# Patient Record
Sex: Male | Born: 1955 | Race: White | Hispanic: No | Marital: Married | Smoking: Former smoker
Health system: Southern US, Community
[De-identification: ages and names within clinical notes are randomized; demographics above are authoritative.]

## PROBLEM LIST (undated history)

## (undated) DIAGNOSIS — M1990 Unspecified osteoarthritis, unspecified site: Secondary | ICD-10-CM

## (undated) DIAGNOSIS — I10 Essential (primary) hypertension: Secondary | ICD-10-CM

## (undated) DIAGNOSIS — F32A Depression, unspecified: Secondary | ICD-10-CM

## (undated) DIAGNOSIS — J449 Chronic obstructive pulmonary disease, unspecified: Secondary | ICD-10-CM

## (undated) DIAGNOSIS — G4730 Sleep apnea, unspecified: Secondary | ICD-10-CM

## (undated) DIAGNOSIS — L409 Psoriasis, unspecified: Secondary | ICD-10-CM

## (undated) DIAGNOSIS — F429 Obsessive-compulsive disorder, unspecified: Secondary | ICD-10-CM

## (undated) DIAGNOSIS — M069 Rheumatoid arthritis, unspecified: Secondary | ICD-10-CM

## (undated) DIAGNOSIS — Z8719 Personal history of other diseases of the digestive system: Secondary | ICD-10-CM

## (undated) DIAGNOSIS — Z8619 Personal history of other infectious and parasitic diseases: Secondary | ICD-10-CM

## (undated) DIAGNOSIS — E785 Hyperlipidemia, unspecified: Secondary | ICD-10-CM

## (undated) DIAGNOSIS — F5104 Psychophysiologic insomnia: Secondary | ICD-10-CM

## (undated) DIAGNOSIS — E039 Hypothyroidism, unspecified: Secondary | ICD-10-CM

## (undated) DIAGNOSIS — G3184 Mild cognitive impairment, so stated: Secondary | ICD-10-CM

## (undated) DIAGNOSIS — C801 Malignant (primary) neoplasm, unspecified: Secondary | ICD-10-CM

## (undated) DIAGNOSIS — T7840XA Allergy, unspecified, initial encounter: Secondary | ICD-10-CM

## (undated) DIAGNOSIS — J45909 Unspecified asthma, uncomplicated: Secondary | ICD-10-CM

## (undated) DIAGNOSIS — E237 Disorder of pituitary gland, unspecified: Secondary | ICD-10-CM

## (undated) DIAGNOSIS — N529 Male erectile dysfunction, unspecified: Secondary | ICD-10-CM

## (undated) DIAGNOSIS — M2550 Pain in unspecified joint: Secondary | ICD-10-CM

## (undated) DIAGNOSIS — F329 Major depressive disorder, single episode, unspecified: Secondary | ICD-10-CM

## (undated) HISTORY — DX: Personal history of other diseases of the digestive system: Z87.19

## (undated) HISTORY — DX: Obsessive-compulsive disorder, unspecified: F42.9

## (undated) HISTORY — DX: Sleep apnea, unspecified: G47.30

## (undated) HISTORY — DX: Hypothyroidism, unspecified: E03.9

## (undated) HISTORY — DX: Essential (primary) hypertension: I10

## (undated) HISTORY — DX: Depression, unspecified: F32.A

## (undated) HISTORY — DX: Rheumatoid arthritis, unspecified: M06.9

## (undated) HISTORY — PX: MOHS SURGERY: SUR867

## (undated) HISTORY — DX: Major depressive disorder, single episode, unspecified: F32.9

## (undated) HISTORY — PX: CYSTECTOMY: SUR359

## (undated) HISTORY — PX: APPENDECTOMY: SHX54

## (undated) HISTORY — DX: Allergy, unspecified, initial encounter: T78.40XA

## (undated) HISTORY — DX: Unspecified osteoarthritis, unspecified site: M19.90

## (undated) HISTORY — DX: Personal history of other infectious and parasitic diseases: Z86.19

## (undated) HISTORY — DX: Malignant (primary) neoplasm, unspecified: C80.1

## (undated) HISTORY — PX: LAMINECTOMY: SHX219

## (undated) HISTORY — DX: Psoriasis, unspecified: L40.9

## (undated) HISTORY — DX: Hyperlipidemia, unspecified: E78.5

## (undated) HISTORY — DX: Male erectile dysfunction, unspecified: N52.9

## (undated) HISTORY — PX: OTHER SURGICAL HISTORY: SHX169

## (undated) HISTORY — PX: TONSILLECTOMY: SUR1361

---

## 2004-12-29 ENCOUNTER — Ambulatory Visit: Payer: Self-pay | Admitting: Internal Medicine

## 2005-01-11 ENCOUNTER — Ambulatory Visit: Payer: Self-pay | Admitting: Internal Medicine

## 2005-08-10 ENCOUNTER — Ambulatory Visit: Payer: Self-pay | Admitting: Physician Assistant

## 2005-08-12 ENCOUNTER — Observation Stay: Payer: Self-pay | Admitting: Unknown Physician Specialty

## 2005-08-12 ENCOUNTER — Other Ambulatory Visit: Payer: Self-pay

## 2008-04-01 ENCOUNTER — Ambulatory Visit: Payer: Self-pay

## 2010-12-07 ENCOUNTER — Ambulatory Visit: Payer: Self-pay | Admitting: Gastroenterology

## 2011-12-16 ENCOUNTER — Ambulatory Visit: Payer: Self-pay | Admitting: Internal Medicine

## 2012-12-24 ENCOUNTER — Inpatient Hospital Stay: Payer: Self-pay | Admitting: Internal Medicine

## 2012-12-24 LAB — COMPREHENSIVE METABOLIC PANEL
Albumin: 4.1 g/dL (ref 3.4–5.0)
Anion Gap: 6 — ABNORMAL LOW (ref 7–16)
BUN: 16 mg/dL (ref 7–18)
Bilirubin,Total: 0.7 mg/dL (ref 0.2–1.0)
Calcium, Total: 8.7 mg/dL (ref 8.5–10.1)
Co2: 27 mmol/L (ref 21–32)
Creatinine: 1.17 mg/dL (ref 0.60–1.30)
Glucose: 134 mg/dL — ABNORMAL HIGH (ref 65–99)
Osmolality: 279 (ref 275–301)
Potassium: 3.3 mmol/L — ABNORMAL LOW (ref 3.5–5.1)

## 2012-12-24 LAB — CBC
HGB: 16 g/dL (ref 13.0–18.0)
MCH: 29.9 pg (ref 26.0–34.0)
MCV: 90 fL (ref 80–100)
Platelet: 283 10*3/uL (ref 150–440)
RBC: 5.34 10*6/uL (ref 4.40–5.90)
RDW: 14.3 % (ref 11.5–14.5)
WBC: 15.6 10*3/uL — ABNORMAL HIGH (ref 3.8–10.6)

## 2012-12-24 LAB — TROPONIN I: Troponin-I: <0.02 ng/mL

## 2012-12-25 LAB — CBC WITH DIFFERENTIAL/PLATELET
Basophil #: 0 10*3/uL (ref 0.0–0.1)
Basophil %: 0.1 %
Eosinophil %: 0 %
HCT: 45.7 % (ref 40.0–52.0)
HGB: 15.5 g/dL (ref 13.0–18.0)
Lymphocyte %: 3.2 %
MCHC: 33.9 g/dL (ref 32.0–36.0)
MCV: 91 fL (ref 80–100)
Monocyte #: 0.9 x10 3/mm (ref 0.2–1.0)
Monocyte %: 5.7 %
Neutrophil #: 14.5 10*3/uL — ABNORMAL HIGH (ref 1.4–6.5)
Neutrophil %: 91 %
Platelet: 268 10*3/uL (ref 150–440)
WBC: 16 10*3/uL — ABNORMAL HIGH (ref 3.8–10.6)

## 2012-12-25 LAB — BASIC METABOLIC PANEL
Anion Gap: 5 — ABNORMAL LOW (ref 7–16)
BUN: 14 mg/dL (ref 7–18)
Calcium, Total: 7.8 mg/dL — ABNORMAL LOW (ref 8.5–10.1)
Chloride: 108 mmol/L — ABNORMAL HIGH (ref 98–107)
Creatinine: 1.11 mg/dL (ref 0.60–1.30)
EGFR (Non-African Amer.): >60
Glucose: 125 mg/dL — ABNORMAL HIGH (ref 65–99)
Osmolality: 285 (ref 275–301)
Sodium: 142 mmol/L (ref 136–145)

## 2012-12-25 LAB — URINALYSIS, COMPLETE
Blood: NEGATIVE
Glucose,UR: NEGATIVE mg/dL (ref 0–75)
Ketone: NEGATIVE
Protein: NEGATIVE
Specific Gravity: 1.01 (ref 1.003–1.030)

## 2012-12-25 LAB — PHOSPHORUS: Phosphorus: 2.4 mg/dL — ABNORMAL LOW (ref 2.5–4.9)

## 2012-12-25 LAB — HEMOGLOBIN A1C: Hemoglobin A1C: 6.1 % (ref 4.2–6.3)

## 2012-12-25 LAB — LIPID PANEL
Cholesterol: 148 mg/dL (ref 0–200)
HDL Cholesterol: 62 mg/dL — ABNORMAL HIGH (ref 40–60)
Triglycerides: 127 mg/dL (ref 0–200)
VLDL Cholesterol, Calc: 25 mg/dL (ref 5–40)

## 2012-12-26 LAB — COMPREHENSIVE METABOLIC PANEL
Albumin: 3 g/dL — ABNORMAL LOW (ref 3.4–5.0)
Alkaline Phosphatase: 85 U/L (ref 50–136)
Anion Gap: 5 — ABNORMAL LOW (ref 7–16)
BUN: 18 mg/dL (ref 7–18)
Bilirubin,Total: 0.7 mg/dL (ref 0.2–1.0)
Calcium, Total: 7.6 mg/dL — ABNORMAL LOW (ref 8.5–10.1)
Chloride: 110 mmol/L — ABNORMAL HIGH (ref 98–107)
EGFR (African American): >60
Glucose: 109 mg/dL — ABNORMAL HIGH (ref 65–99)
SGOT(AST): 15 U/L (ref 15–37)
SGPT (ALT): 23 U/L (ref 12–78)
Sodium: 143 mmol/L (ref 136–145)
Total Protein: 6.5 g/dL (ref 6.4–8.2)

## 2012-12-26 LAB — CBC WITH DIFFERENTIAL/PLATELET
Basophil %: 0.2 %
Eosinophil #: 0 10*3/uL (ref 0.0–0.7)
Eosinophil %: 0.3 %
Lymphocyte #: 0.5 10*3/uL — ABNORMAL LOW (ref 1.0–3.6)
Lymphocyte %: 3.3 %
MCHC: 34 g/dL (ref 32.0–36.0)
MCV: 91 fL (ref 80–100)
Monocyte #: 1.1 x10 3/mm — ABNORMAL HIGH (ref 0.2–1.0)
Neutrophil #: 13.5 10*3/uL — ABNORMAL HIGH (ref 1.4–6.5)
Platelet: 258 10*3/uL (ref 150–440)

## 2012-12-27 LAB — BASIC METABOLIC PANEL
BUN: 13 mg/dL (ref 7–18)
Calcium, Total: 7.3 mg/dL — ABNORMAL LOW (ref 8.5–10.1)
Chloride: 109 mmol/L — ABNORMAL HIGH (ref 98–107)
Co2: 23 mmol/L (ref 21–32)
Creatinine: 1 mg/dL (ref 0.60–1.30)
EGFR (African American): >60
EGFR (Non-African Amer.): >60
Osmolality: 277 (ref 275–301)
Potassium: 3.4 mmol/L — ABNORMAL LOW (ref 3.5–5.1)
Sodium: 139 mmol/L (ref 136–145)

## 2012-12-29 LAB — CBC WITH DIFFERENTIAL/PLATELET
Basophil #: 0 10*3/uL (ref 0.0–0.1)
Basophil %: 0.1 %
Eosinophil %: 0.1 %
HCT: 36.6 % — ABNORMAL LOW (ref 40.0–52.0)
HGB: 12.5 g/dL — ABNORMAL LOW (ref 13.0–18.0)
Lymphocyte %: 5.9 %
MCH: 30.8 pg (ref 26.0–34.0)
MCHC: 34.2 g/dL (ref 32.0–36.0)
Neutrophil #: 9 10*3/uL — ABNORMAL HIGH (ref 1.4–6.5)
Neutrophil %: 88.5 %
Platelet: 339 10*3/uL (ref 150–440)
RBC: 4.05 10*6/uL — ABNORMAL LOW (ref 4.40–5.90)

## 2012-12-29 LAB — COMPREHENSIVE METABOLIC PANEL
Albumin: 2.5 g/dL — ABNORMAL LOW (ref 3.4–5.0)
Alkaline Phosphatase: 123 U/L (ref 50–136)
Anion Gap: 4 — ABNORMAL LOW (ref 7–16)
BUN: 11 mg/dL (ref 7–18)
Bilirubin,Total: 0.4 mg/dL (ref 0.2–1.0)
Calcium, Total: 8.4 mg/dL — ABNORMAL LOW (ref 8.5–10.1)
Chloride: 102 mmol/L (ref 98–107)
Co2: 30 mmol/L (ref 21–32)
Creatinine: 1.04 mg/dL (ref 0.60–1.30)
EGFR (Non-African Amer.): >60
Glucose: 146 mg/dL — ABNORMAL HIGH (ref 65–99)
Potassium: 3.3 mmol/L — ABNORMAL LOW (ref 3.5–5.1)
SGPT (ALT): 46 U/L (ref 12–78)
Total Protein: 6.8 g/dL (ref 6.4–8.2)

## 2012-12-29 LAB — LIPASE, BLOOD: Lipase: 114 U/L (ref 73–393)

## 2012-12-30 LAB — COMPREHENSIVE METABOLIC PANEL
Albumin: 2.7 g/dL — ABNORMAL LOW (ref 3.4–5.0)
Alkaline Phosphatase: 116 U/L (ref 50–136)
Anion Gap: 3 — ABNORMAL LOW (ref 7–16)
BUN: 14 mg/dL (ref 7–18)
Bilirubin,Total: 0.4 mg/dL (ref 0.2–1.0)
Calcium, Total: 8.4 mg/dL — ABNORMAL LOW (ref 8.5–10.1)
Creatinine: 1.22 mg/dL (ref 0.60–1.30)
EGFR (African American): >60
EGFR (Non-African Amer.): >60
Glucose: 99 mg/dL (ref 65–99)
Potassium: 3.5 mmol/L (ref 3.5–5.1)
SGOT(AST): 40 U/L — ABNORMAL HIGH (ref 15–37)
Total Protein: 6.8 g/dL (ref 6.4–8.2)

## 2012-12-30 LAB — LIPASE, BLOOD: Lipase: 272 U/L (ref 73–393)

## 2012-12-30 LAB — CBC WITH DIFFERENTIAL/PLATELET
Basophil #: 0 10*3/uL (ref 0.0–0.1)
Eosinophil #: 0.1 10*3/uL (ref 0.0–0.7)
Eosinophil %: 0.5 %
HGB: 13.7 g/dL (ref 13.0–18.0)
Lymphocyte #: 0.6 10*3/uL — ABNORMAL LOW (ref 1.0–3.6)
MCH: 30.3 pg (ref 26.0–34.0)
Monocyte #: 1.3 x10 3/mm — ABNORMAL HIGH (ref 0.2–1.0)
Neutrophil #: 11.8 10*3/uL — ABNORMAL HIGH (ref 1.4–6.5)
Platelet: 365 10*3/uL (ref 150–440)
RBC: 4.53 10*6/uL (ref 4.40–5.90)
RDW: 14.5 % (ref 11.5–14.5)

## 2012-12-31 LAB — CBC WITH DIFFERENTIAL/PLATELET
Basophil #: 0 x10 3/mm 3
Basophil %: 0.1 %
Eosinophil #: 0.1 x10 3/mm 3
Eosinophil %: 1 %
HCT: 37.8 % — ABNORMAL LOW
HGB: 12.8 g/dL — ABNORMAL LOW
Lymphocyte %: 5.1 %
Lymphs Abs: 0.7 x10 3/mm 3 — ABNORMAL LOW
MCH: 30.9 pg
MCHC: 33.9 g/dL
MCV: 91 fL
Monocyte #: 1.1 "x10 3/mm " — ABNORMAL HIGH
Monocyte %: 8.3 %
Neutrophil #: 11.3 x10 3/mm 3 — ABNORMAL HIGH
Neutrophil %: 85.5 %
Platelet: 312 x10 3/mm 3
RBC: 4.15 x10 6/mm 3 — ABNORMAL LOW
RDW: 15 % — ABNORMAL HIGH
WBC: 13.2 x10 3/mm 3 — ABNORMAL HIGH

## 2012-12-31 LAB — COMPREHENSIVE METABOLIC PANEL
Albumin: 2.3 g/dL — ABNORMAL LOW (ref 3.4–5.0)
Alkaline Phosphatase: 122 U/L (ref 50–136)
Anion Gap: 8 (ref 7–16)
BUN: 13 mg/dL (ref 7–18)
Calcium, Total: 7.9 mg/dL — ABNORMAL LOW (ref 8.5–10.1)
Chloride: 107 mmol/L (ref 98–107)
Creatinine: 0.62 mg/dL (ref 0.60–1.30)
EGFR (African American): >60
EGFR (Non-African Amer.): >60
Glucose: 77 mg/dL (ref 65–99)
Osmolality: 276 (ref 275–301)
SGOT(AST): 48 U/L — ABNORMAL HIGH (ref 15–37)

## 2012-12-31 LAB — LIPASE, BLOOD: Lipase: 227 U/L (ref 73–393)

## 2013-01-02 LAB — CBC WITH DIFFERENTIAL/PLATELET
Basophil #: 0 10*3/uL (ref 0.0–0.1)
Eosinophil #: 0.2 10*3/uL (ref 0.0–0.7)
Eosinophil %: 0.9 %
HCT: 42.8 % (ref 40.0–52.0)
HGB: 14.5 g/dL (ref 13.0–18.0)
Lymphocyte #: 0.7 10*3/uL — ABNORMAL LOW (ref 1.0–3.6)
Lymphocyte %: 4 %
MCHC: 33.9 g/dL (ref 32.0–36.0)
Monocyte #: 1.1 x10 3/mm — ABNORMAL HIGH (ref 0.2–1.0)
Neutrophil %: 89.3 %
Platelet: 386 10*3/uL (ref 150–440)
RBC: 4.79 10*6/uL (ref 4.40–5.90)
WBC: 18.5 10*3/uL — ABNORMAL HIGH (ref 3.8–10.6)

## 2013-01-02 LAB — COMPREHENSIVE METABOLIC PANEL
Albumin: 2.3 g/dL — ABNORMAL LOW (ref 3.4–5.0)
Anion Gap: 8 (ref 7–16)
BUN: 9 mg/dL (ref 7–18)
Chloride: 104 mmol/L (ref 98–107)
Co2: 24 mmol/L (ref 21–32)
Creatinine: 0.92 mg/dL (ref 0.60–1.30)
EGFR (Non-African Amer.): >60
Glucose: 82 mg/dL (ref 65–99)
Osmolality: 270 (ref 275–301)
SGOT(AST): 40 U/L — ABNORMAL HIGH (ref 15–37)
SGPT (ALT): 32 U/L (ref 12–78)
Total Protein: 7 g/dL (ref 6.4–8.2)

## 2013-01-03 LAB — WBC: WBC: 13.9 10*3/uL — ABNORMAL HIGH (ref 3.8–10.6)

## 2013-01-04 LAB — CBC WITH DIFFERENTIAL/PLATELET
Bands: 11 %
Eosinophil: 2 %
MCH: 30.4 pg (ref 26.0–34.0)
MCHC: 34.2 g/dL (ref 32.0–36.0)
MCV: 89 fL (ref 80–100)
Monocytes: 8 %
Platelet: 353 10*3/uL (ref 150–440)
Segmented Neutrophils: 69 %

## 2014-04-14 IMAGING — CR DG ABDOMEN 2V
1 series · 4 of 4 positions shown · non-contrast
Comparison: none

REASON FOR EXAM: abdominal distension, recent ileus with pancreatitis
COMMENTS:

[Series 11: w abdomen upright · 0.14mm/px · 4 of 4 slices shown]
[im 1/4]
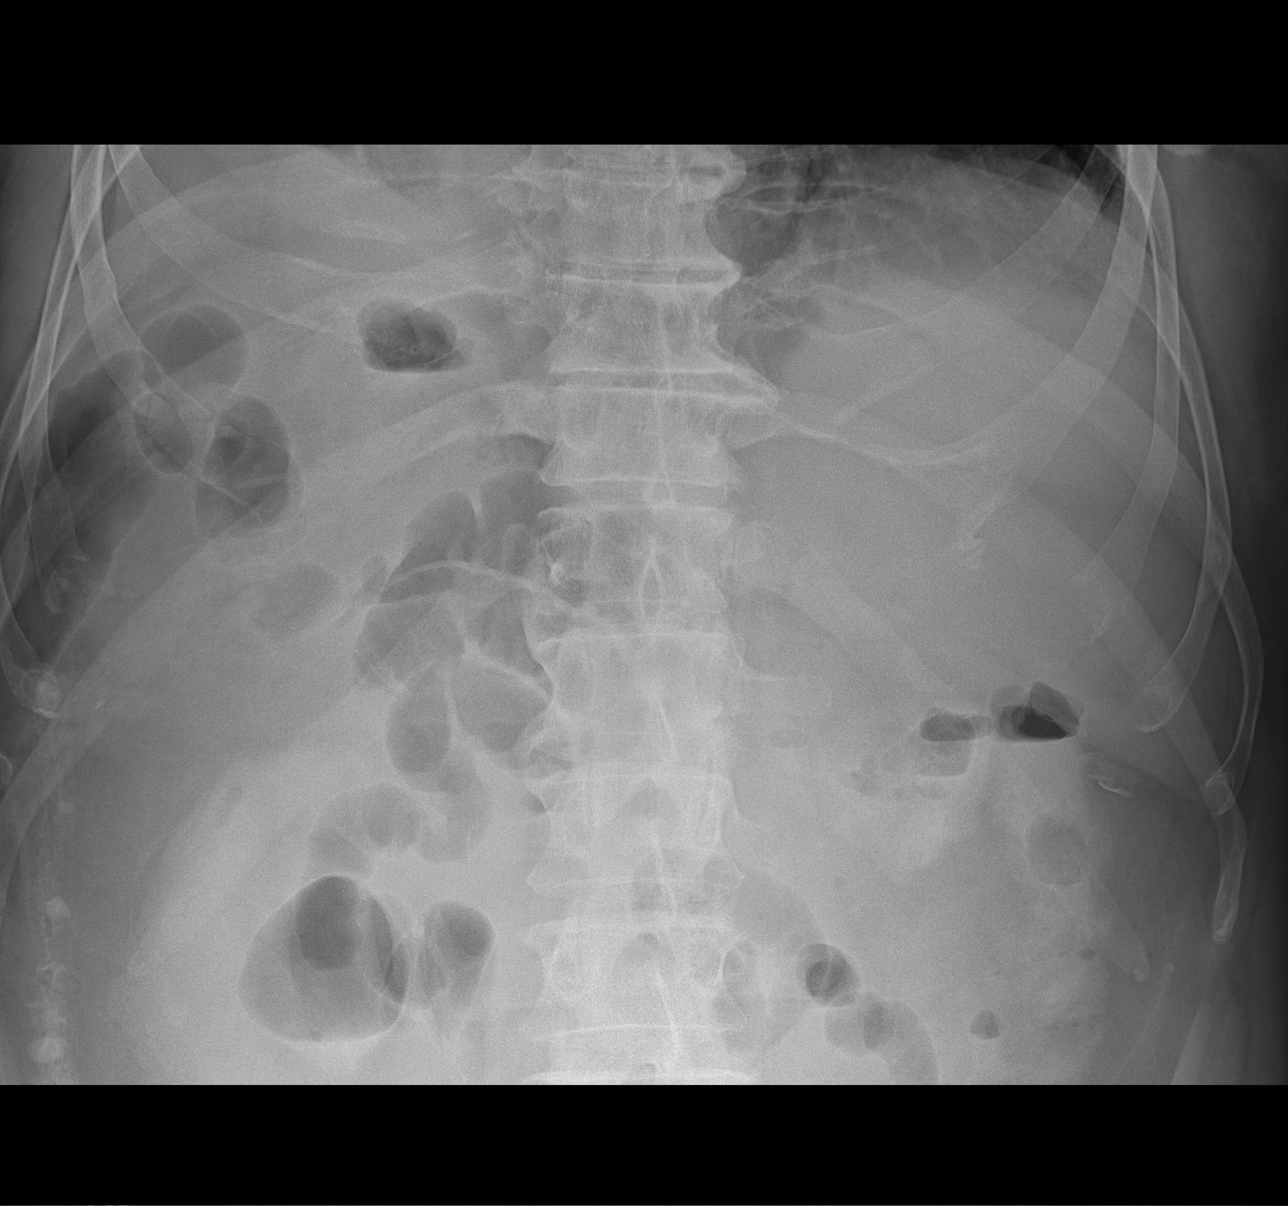
[im 2/4]
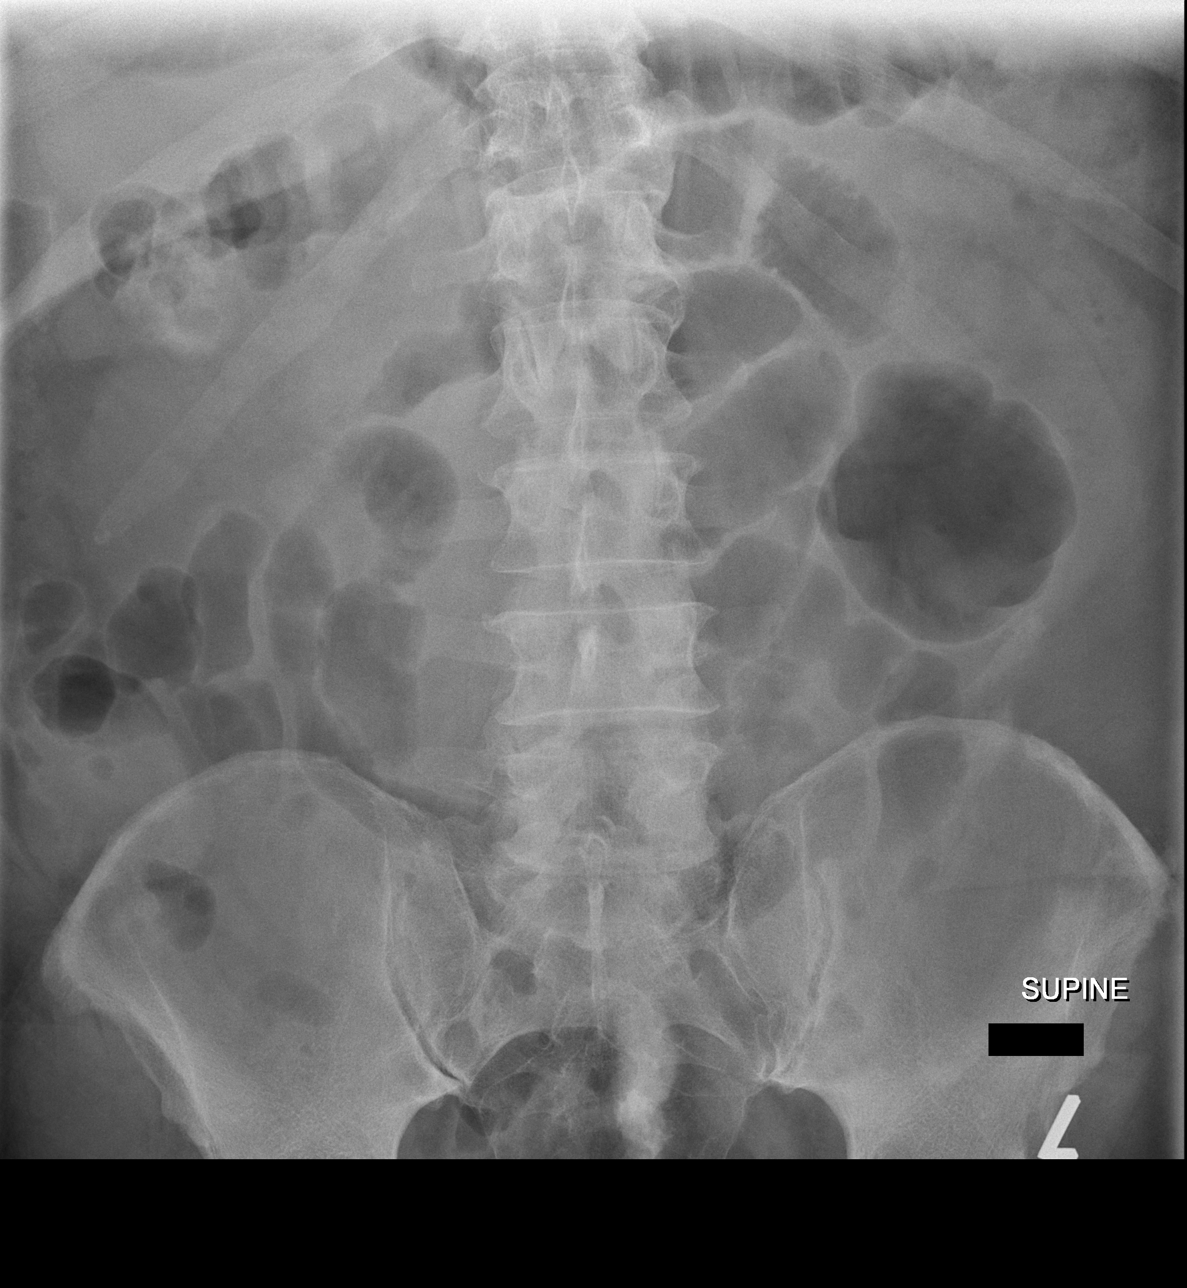
[im 3/4]
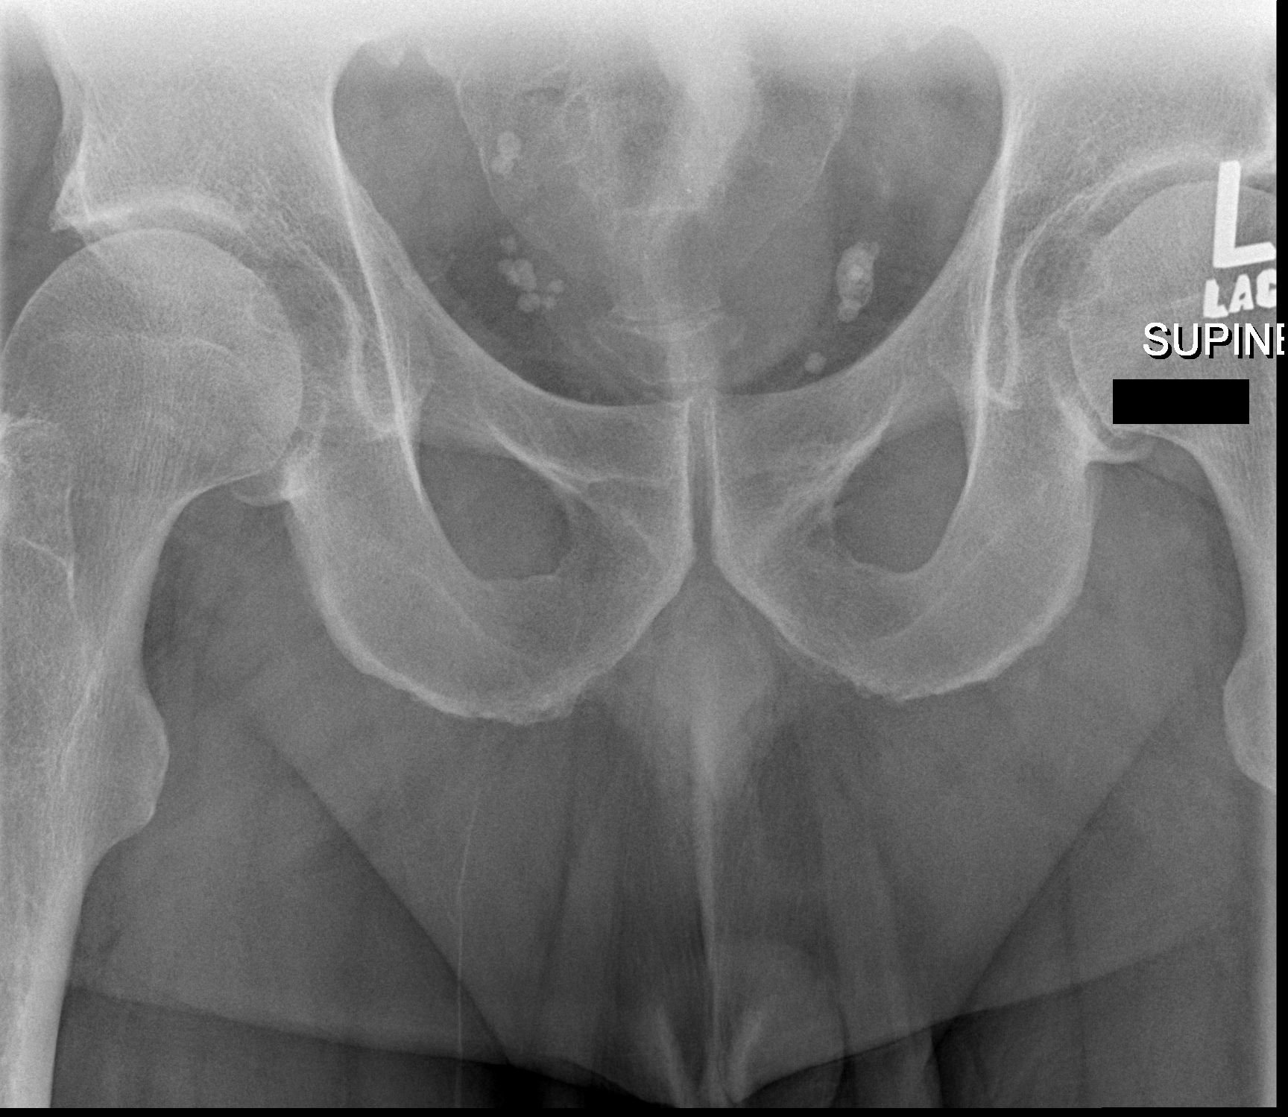
[im 4/4]
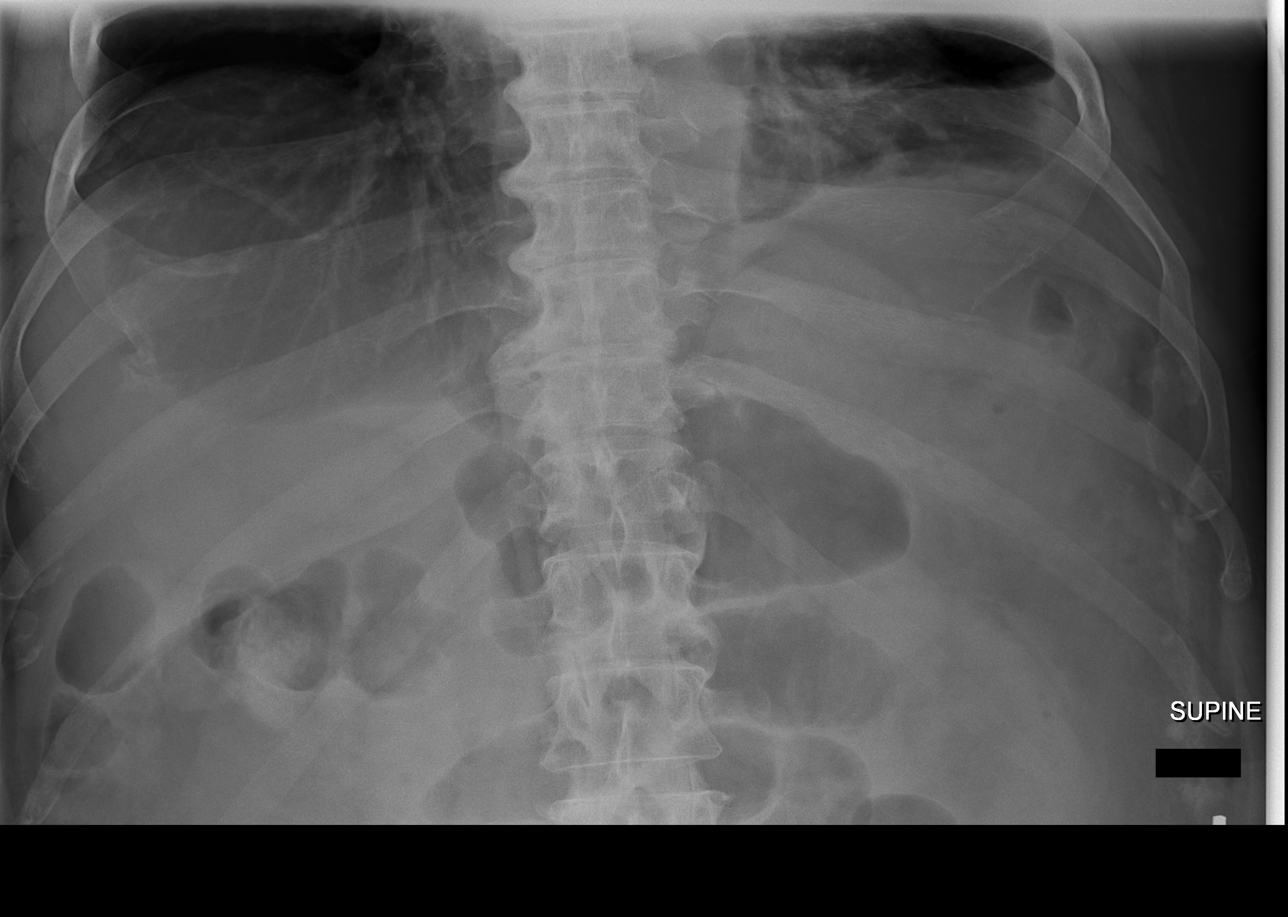

[4 of 4 positions shown; findings below may reference images not displayed]

PROCEDURE:     DXR - DXR ABDOMEN 2 V FLAT AND ERECT  - January 02, 2013  [DATE]

RESULT:     Comparison is made to study 29 December, 2012.

There remains a moderate amount of gas within loops of small and large
bowel. There is a small amount of gas in the sigmoid. The pattern is
nonspecific but a generalized mild ileus is suspected. There are phleboliths
within the pelvis. The stomach does not appear distended.
IMPRESSION: The bowel gas pattern is nonspecific and may reflect a mild
generalized ileus. No free extraluminal gas collections are demonstrated.

[REDACTED]

## 2014-09-25 ENCOUNTER — Ambulatory Visit (INDEPENDENT_AMBULATORY_CARE_PROVIDER_SITE_OTHER): Payer: Self-pay | Admitting: Internal Medicine

## 2014-09-25 ENCOUNTER — Encounter: Payer: Self-pay | Admitting: Internal Medicine

## 2014-09-25 ENCOUNTER — Encounter (INDEPENDENT_AMBULATORY_CARE_PROVIDER_SITE_OTHER): Payer: Self-pay

## 2014-09-25 VITALS — BP 100/80 | HR 85 | Temp 98.0°F | Ht 68.0 in | Wt 260.0 lb

## 2014-09-25 DIAGNOSIS — J449 Chronic obstructive pulmonary disease, unspecified: Secondary | ICD-10-CM | POA: Insufficient documentation

## 2014-09-25 DIAGNOSIS — Z9989 Dependence on other enabling machines and devices: Secondary | ICD-10-CM

## 2014-09-25 DIAGNOSIS — G4733 Obstructive sleep apnea (adult) (pediatric): Secondary | ICD-10-CM

## 2014-09-25 DIAGNOSIS — E669 Obesity, unspecified: Secondary | ICD-10-CM | POA: Insufficient documentation

## 2014-09-25 MED ORDER — FLUTICASONE FUROATE-VILANTEROL 200-25 MCG/INH IN AEPB: 200.0000 ug | INHALATION_SPRAY | Freq: Every day | RESPIRATORY_TRACT | Status: DC

## 2014-09-25 NOTE — Progress Notes (Signed)
Date: 09/25/2014  MRN# [id number] Frank Golden May 11, 1956  Referring Physician: self referral  Frank Golden is a 59 y.o. old male seen in consultation for dyspnea on exertion for the past 6 months.  CC:  Chief Complaint  Patient presents with  . Advice Only    Pt referred for sob and wheezing, cough with mucus unsure of color, and chest tightness for 6 months. Pt wears CPAP 8hrs nightly but unsure of DME. Pt orders his own supplies offline as he has no insurance.    HPI:  Patient is a 59 year old male presenting today for dyspnea on exertion for the past 6 months. History per the patient, he states that he had these complaints with his regular doctor and he was seen by Dr. Caryl Comes, a stress test was ordered, during a stress test to develop severe shortness of breath, wheezing and chest tightness, stress test was stopped immediately and patient took about 5 minutes to calm down. After 5 minutes he completed a stress test for which she was told it was completely normal. He describes dyspnea on exertion as chronic especially with doing yardwork even after 5 minutes, he used albuterol about 5 years ago and had some relief from it. He also endorses a chronic cough mostly in the mornings. In the last 5 years he states he may have had steroids for suspected upper spray tract infections 1-2 times. He also states his history of obstructive sleep apnea, he uses CPAP machine 7 nights a week for about 8 hours per night. In the past he had also been placed on Flovent for suspected asthma or allergies. He does endorse a history of smoking quitting in 2000, previous to smoke a pack a day for 15 years. 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 Patient was diagnosed with obstructive sleep apnea about 6 years ago and he's been on CPAP since then.   PMHX:   Past Medical History  Diagnosis Date  . Psoriasis   . OCD (obsessive compulsive disorder)   . ED (erectile dysfunction)    . Hypertension   . History of pancreatitis   . History of chicken pox   . DJD (degenerative joint disease)   . Hyperlipidemia   . Sleep apnea     uses CPAP  . Hypothyroid   . Rheumatoid arthritis   . Depression   . Allergic state    Surgical Hx:  Past Surgical History  Procedure Laterality Date  . Laminectomy    . Endoscopic carpal tunnell    . Mohs surgery      basal cell on nose  . Appendectomy    . Cystectomy     Family Hx:  Family History  Problem Relation Age of Onset  . COPD Mother   . Arthritis Mother   . Osteoporosis Mother   . Stroke Father   . Cancer Father     penile  . Heart failure Father   . Aneurysm Father   . Hypertension Father   . Coronary artery disease Father    Social Hx:   History  Substance Use Topics  . Smoking status: Former Smoker -- 1.00 packs/day for 15 years    Types: Cigarettes  . Smokeless tobacco: Never Used  . Alcohol Use: 0.0 oz/week    0 Standard drinks or equivalent per week   Medication:   Current Outpatient Rx  Name  Route  Sig  Dispense  Refill  . albuterol (PROVENTIL HFA;VENTOLIN HFA) 108 (  90 BASE) MCG/ACT inhaler   Inhalation   Inhale 2 puffs into the lungs as needed.         . ALPRAZolam (XANAX) 1 MG tablet   Oral   Take 1 mg by mouth 3 (three) times daily as needed.         . Coenzyme Q10 (COQ-10) 100 MG capsule   Oral   Take 100 mg by mouth daily. With red yeast rice.         Marland Kitchen EPINEPHrine 0.3 mg/0.3 mL IJ SOAJ injection   Intramuscular   Inject 0.3 mg into the muscle as needed.         . fluticasone (FLONASE) 50 MCG/ACT nasal spray   Nasal   Place 2 sprays into the nose daily.         . hydrochlorothiazide (HYDRODIURIL) 25 MG tablet   Oral   Take 25 mg by mouth daily.         Marland Kitchen levothyroxine (SYNTHROID, LEVOTHROID) 112 MCG tablet   Oral   Take 112 mcg by mouth daily.         . OMEGA-3 FATTY ACIDS PO   Oral   Take 1 capsule by mouth daily.         . saw palmetto 160 MG  capsule   Oral   Take 1 capsule by mouth daily.         . valsartan (DIOVAN) 320 MG tablet   Oral   Take 320 mg by mouth daily.         . vitamin E 1000 UNIT capsule   Oral   Take 1 capsule by mouth daily.             Allergies:  Other and Quinapril hcl  Review of Systems: Gen:  Denies  fever, sweats, chills HEENT: Denies blurred vision, double vision, ear pain, eye pain, hearing loss, nose bleeds, sore throat Cvc:  No dizziness, chest pain or heaviness Resp:   Denies cough or sputum porduction, shortness of breath Gi: Denies swallowing difficulty, stomach pain, nausea or vomiting, diarrhea, constipation, bowel incontinence Gu:  Denies bladder incontinence, burning urine Ext:   No Joint pain, stiffness or swelling Skin: No skin rash, easy bruising or bleeding or hives Endoc:  No polyuria, polydipsia , polyphagia or weight change Psych: No depression, insomnia or hallucinations  Other:  All other systems negative  Physical Examination:   VS: BP 100/80 mmHg  Pulse 85  Temp(Src) 98 F (36.7 C)  Ht 5\' 8"  (1.727 m)  Wt 260 lb (117.935 kg)  BMI 39.54 kg/m2  SpO2 94%  General Appearance: No distress  Neuro:without focal findings, mental status, speech normal, alert and oriented, cranial nerves 2-12 intact, reflexes normal and symmetric, sensation grossly normal  HEENT: PERRLA, EOM intact, no ptosis, no other lesions noticed; Mallampati 2 Pulmonary: good respiratory effort, noted to have diffuse end expiratory wheeze, shallow breath sounds at the bilateral bases. No use of intercostal muscles. CardiovascularNormal S1,S2.  No m/r/g.  Abdominal aorta pulsation normal.    Abdomen: Benign, Soft, non-tender, No masses, hepatosplenomegaly, No lymphadenopathy Renal:  No costovertebral tenderness  GU:  No performed at this time. Endoc: No evident thyromegaly, no signs of acromegaly or Cushing features Skin:   warm, no rashes, no ecchymosis  Extremities: normal, no cyanosis,  clubbing, no edema, warm with normal capillary refill. Other findings:none   Labs results:   Rad results:    Threadmill stress test: 09/15/14 INTERPRETATION Normal Stress Echocardiogram NORMAL  RIGHT VENTRICULAR SYSTOLIC FUNCTION TRIVIAL REGURGITATION NOTED (See above) NO VALVULAR STENOSIS NOTED    Other:   Assessment and Plan:60 year old male with past medical history of hypothyroidism, central sleep apnea, previous tobacco abuse, seen in consultation for chronic dyspnea with negative stress test. Obesity OBESITY  Discussed importance of weight reduction.  Educated regarding limitation of  intake of greasy/fried foods.  Instructed on benefit of  a low-impact exercise program, starting slowly.  Discussed benefits of 30-45 minutes of some form of exercise daily as well as benefit of supervised exercise program.      OSA on CPAP Patient with known history of obstructive sleep apnea, we will try to obtain a copy of his sleep study from [facility].  Patient educated on continued use of CPAP machine, and being greater than 70% compliant, patient also educated on the benefits of using CPAP.   COPD (chronic obstructive pulmonary disease) I suspect the patient's shortness of breath/dyspnea is most likely due to COPD given his prior history of prolonged smoking, prior upper respiratory tract infections, relief of symptoms from albuterol and inhaled steroids in the past.  Plan: - pulmonary function testing and 6 minute walk test prior to follow up - Chest xray, 2view, prior to follow up - BreoEllipta 25/200 mcg - 1 puff every morning, gargle and rinse after each use - diet and exercise - continue avoid tobacco exposure (smoking, 2nd hand smoking, ecig, vapors, etc.)     Updated Medication List Outpatient Encounter Prescriptions as of 09/25/2014  Medication Sig  . albuterol (PROVENTIL HFA;VENTOLIN HFA) 108 (90 BASE) MCG/ACT inhaler Inhale 2 puffs into the lungs as needed.  .  ALPRAZolam (XANAX) 1 MG tablet Take 1 mg by mouth 3 (three) times daily as needed.  . Coenzyme Q10 (COQ-10) 100 MG capsule Take 100 mg by mouth daily. With red yeast rice.  Marland Kitchen EPINEPHrine 0.3 mg/0.3 mL IJ SOAJ injection Inject 0.3 mg into the muscle as needed.  . fluticasone (FLONASE) 50 MCG/ACT nasal spray Place 2 sprays into the nose daily.  . hydrochlorothiazide (HYDRODIURIL) 25 MG tablet Take 25 mg by mouth daily.  Marland Kitchen levothyroxine (SYNTHROID, LEVOTHROID) 112 MCG tablet Take 112 mcg by mouth daily.  . OMEGA-3 FATTY ACIDS PO Take 1 capsule by mouth daily.  . saw palmetto 160 MG capsule Take 1 capsule by mouth daily.  . valsartan (DIOVAN) 320 MG tablet Take 320 mg by mouth daily.  . vitamin E 1000 UNIT capsule Take 1 capsule by mouth daily.    Orders for this visit: Orders Placed This Encounter  Procedures  . DG Chest 2 View    Standing Status: Future     Number of Occurrences: 1     Standing Expiration Date: 11/25/2015    Scheduling Instructions:     Schedule mid May 2016.    Order Specific Question:  Reason for Exam (SYMPTOM  OR DIAGNOSIS REQUIRED)    Answer:  cough,sob    Order Specific Question:  Preferred imaging location?    Answer:  [facility]  . Pulmonary function test    Standing Status: Future     Number of Occurrences: 1     Standing Expiration Date: 09/25/2015    Scheduling Instructions:     [place]    Order Specific Question:  Where should this test be performed?    Answer:  [facility]    Order Specific Question:  Full PFT: includes the following: basic spirometry, spirometry pre & post bronchodilator, diffusion capacity (DLCO), lung  volumes    Answer:  Full PFT    Order Specific Question:  MIP/MEP    Answer:  No    Order Specific Question:  6 minute walk    Answer:  Yes    Order Specific Question:  ABG    Answer:  No    Order Specific Question:  Diffusion capacity (DLCO)    Answer:  No    Order Specific Question:  Lung volumes    Answer:   No    Order Specific Question:  Methacholine challenge    Answer:  No     Thank  you for the consultation and for allowing [facility] to assist in the care of your patient. Our recommendations are noted above.  Please contact us if we can be of further service.   Vilinda Boehringer, MD [facility] Office Number: [phone]

## 2014-09-25 NOTE — Patient Instructions (Addendum)
Follow up with Dr. Stevenson Clinch in 6 weeks - pulmonary function testing and 6 minute walk test prior to follow up - Chest xray, 2view, prior to follow up - BreoEllipta 25/200 mcg - 1 puff every morning, gargle and rinse after each use - diet and exercise - continue to use your cpap night - we will obtain a copy of your sleep study from [facility] - continue avoid tobacco exposure (smoking, 2nd hand smoking, ecig, vapors, etc.)

## 2014-09-30 NOTE — Assessment & Plan Note (Signed)
Patient with known history of obstructive sleep apnea, we will try to obtain a copy of his sleep study from [facility].  Patient educated on continued use of CPAP machine, and being greater than 70% compliant, patient also educated on the benefits of using CPAP.

## 2014-09-30 NOTE — Assessment & Plan Note (Addendum)
I suspect the patient's shortness of breath/dyspnea is most likely due to COPD given his prior history of prolonged smoking, prior upper respiratory tract infections, relief of symptoms from albuterol and inhaled steroids in the past.  Plan: - pulmonary function testing and 6 minute walk test prior to follow up - Chest xray, 2view, prior to follow up - BreoEllipta 25/200 mcg - 1 puff every morning, gargle and rinse after each use - diet and exercise - continue avoid tobacco exposure (smoking, 2nd hand smoking, ecig, vapors, etc.)

## 2014-09-30 NOTE — Assessment & Plan Note (Signed)
OBESITY  Discussed importance of weight reduction.  Educated regarding limitation of  intake of greasy/fried foods.  Instructed on benefit of  a low-impact exercise program, starting slowly.  Discussed benefits of 30-45 minutes of some form of exercise daily as well as benefit of supervised exercise program.    

## 2014-10-03 NOTE — Consult Note (Signed)
Chief Complaint:  Subjective/Chief Complaint seen for acute pancreatitis.  Continues with 3-6 abdominal pain, tolerating full liquids without increased pain.  mild ileus? seen on films yesterday.  no nausea or emesis, passing flatus and small stools per patient   VITAL SIGNS/ANCILLARY NOTES: **Vital Signs.:   24-Jul-14 04:31  Vital Signs Type Routine  Temperature Temperature (F) 98.2  Celsius 36.7  Temperature Source oral  Pulse Pulse 63  Respirations Respirations 20  Systolic BP Systolic BP 786  Diastolic BP (mmHg) Diastolic BP (mmHg) 96  Mean BP 115  Pulse Ox % Pulse Ox % 94  Pulse Ox Activity Level  At rest  Oxygen Delivery Room Air/ 21 %   Brief Assessment:  Cardiac Regular   Respiratory clear BS   Gastrointestinal details normal protuberant, bs positive, but higher pitch and less frequent than yesterday.  positive mild distension, however body habitus also protuberant;   Radiology Results: XRay:    23-Jul-14 15:22, Abdomen Flat and Erect  Abdomen Flat and Erect   REASON FOR EXAM:    abdominal distension, recent ileus with pancreatitis  COMMENTS:       PROCEDURE: DXR - DXR ABDOMEN 2 V FLAT AND ERECT  - Jan 02 2013  3:22PM     RESULT: Comparison is made to study of 29 December 2012.    There remains a moderate amount of gas within loops of small and large   bowel. There is a small amount of gas in the sigmoid. The pattern is   nonspecific but a generalized mild ileus is suspected. There are   phleboliths within the pelvis. The stomach does not appear distended.    IMPRESSION:  The bowel gas pattern is nonspecific and may reflect a mild   generalized ileus. No free extraluminal gas collections are demonstrated.   Dictation Site: 2        Verified By: DAVID A. Martinique, M.D., MD   Assessment/Plan:  Assessment/Plan:  Assessment !) acute pancreatitis, etoh related.  slow resolution.  continue to follow clinically.  Would hold diet at full liquids for a couple days  before advancing.   Plan 1) as above.   Electronic Signatures: Loistine Simas (MD)  (Signed 24-Jul-14 10:27)  Authored: Chief Complaint, VITAL SIGNS/ANCILLARY NOTES, Brief Assessment, Radiology Results, Assessment/Plan   Last Updated: 24-Jul-14 10:27 by Loistine Simas (MD)

## 2014-10-03 NOTE — Consult Note (Signed)
PATIENT NAME:  Frank Golden, Frank Golden MR#:  [id number] DATE OF BIRTH:  06-08-1956  DATE OF CONSULTATION:  12/26/2012  CONSULTING PHYSICIAN:  Corky Sox. Rumor Sun, PA-C, dictating for Dr. Loistine Simas.     REFERRING PHYSICIAN: Dr. Ramonita Lab.   REASON FOR CONSULTATION: Acute pancreatitis.   HISTORY OF PRESENT ILLNESS: This is a pleasant 59 year old gentleman who initially presented to the Emergency Department with acute onset of epigastric abdominal pain and vomiting. He did recently return from vacation where he admits that he was eating poorly and  drinking more alcohol than usual. Upon further workup, he was found to have a markedly elevated lipase level of 5443 and there was radiographic evidence of acute pancreatitis on CT scan. Of note, his triglycerides and LFTs are within normal limits and there is no mention of gallstones on imaging. He was admitted, kept n.p.o. status, started on IV fluids and pain control. Since being admitted, the patient does state that his pain has remained unchanged but is controlled with pain medication. He has just found that he has required administration of pain medication at the same intervals that he was requiring upon admission. He was initiated on ice chips this morning and he states that this has not made the pain worse. Also since being admitted, he states that his bupropion and SSRI have been held, and he is starting to get a headache. He denies any history of acute pancreatitis. He has no known history of liver disease. He has not had a bowel movement since admission and his last bowel movement was the midnight before he went to the Emergency Room. No fever or chills. No chest pain or shortness of breath.   ALLERGIES: BEE STINGS.   PAST MEDICAL HISTORY: Dyslipidemia, hypertension, hypothyroidism, sleep apnea.   PAST SURGICAL HISTORY: Appendectomy, back surgery and skin lesion excision.   FAMILY HISTORY: Negative for GI malignancy, liver disease, colon polyps or IBD.    HOME MEDICATIONS: Prevacid, Lipitor, Flonase, Diovan, Celexa, bupropion, Benadryl and alprazolam.   SOCIAL HISTORY: The patient drinks 3 to 4 alcoholic beverages per day. He denies any tobacco use or illicit drug use.   REVIEW OF SYSTEMS: A 10-system review of systems was obtained on the patient. Pertinent positives are mentioned above and otherwise negative.   OBJECTIVE: VITAL SIGNS: Blood pressure 122/81, heart rate 110, respirations 18, temperature 97.9, bedside pulse oximetry 92%.  GENERAL: This is a pleasant 59 year old male resting quietly and comfortably in the exam room, in no acute distress. Alert and oriented x3.  HEAD: Atraumatic, normocephalic.  NECK: Supple. No lymphadenopathy noted.  HEENT: Sclerae anicteric. Mucous membrane moist.  PULMONARY: Respirations are even and unlabored. Clear to auscultation bilateral anterior lung fields.  HEART: Regular rate and rhythm. S1, S2 noted. ABDOMEN: Soft. Mildly tender to palpation in the epigastric region. Nondistended. Normoactive bowel sounds noted in all 4 quadrants. No guarding or rebound. No masses palpated.  EXTREMITIES: Negative for lower extremity edema; 2+ pulses noted bilaterally.  PSYCHIATRIC: Appropriate mood and affect.   LABORATORY DATA: hemoglobin 15.2, hematocrit 44.6, platelets 258. Lipase 1171, which is improved from 5443. Liver function is within normal limits. Triglycerides are within normal limits. Urinalysis negative. Troponins negative. Sodium 143, potassium 3.6, BUN 18, creatinine 1.44, glucose 109.   IMAGING: CT scan was obtained on the patient showing peripancreatic edema confirming acute pancreatitis. No mention of gallstones.   Ultrasound of the abdomen was obtained on the patient showing fatty liver but no evidence of cholelithiasis.   ASSESSMENT: 1.  Acute pancreatitis, likely alcohol-induced.  2.  Abdominal pain, likely secondary to above.   PLAN: I have discussed this patient's case in detail with  Dr. Loistine Simas who is involved in the development of the patient's plan of care. At this time, the persistence of his pain is likely  from his lingering acute pancreatitis; and therefore, we do recommend continuing current conservative management with IV fluids and pain control. Because he is still complaining of persistent pain and requiring pain medication at a regular interval, we do not recommend advancing the patient's diet at this time. We did also stress the importance of alcohol cessation for continued healing and prevention. Of note, his SSRI had been held since being admitted and from a GI standpoint we do recommend resuming his outpatient SSRI due to abrupt cessation and causing withdrawal symptoms that can often be confused with pancreatitis symptoms. The above plan was discussed with the patient who verbalized understanding and all questions were answered.   Thank you so much for this consultation and for allowing [place] to participate in the patient's plan of care.   Attending gastroenterologist: Dr. Loistine Simas.  ____________________________ Corky Sox. Bari Handshoe, PA-C [id number] D: 12/26/2012 13:34:08 ET T: 12/26/2012 14:00:24 ET JOB#: [id number]  cc: Corky Sox. Vester Titsworth, PA-C, <Dictator> Collins PA ELECTRONICALLY SIGNED 12/31/2012 13:41

## 2014-10-03 NOTE — Consult Note (Signed)
Chief Complaint:  Subjective/Chief Complaint seen for acute pancreatitis.  tolerating clear liquids, still some abdominal pain, variable 2-7.  no n/v, ambulating in the hall, passing flatus and minimal bm.   VITAL SIGNS/ANCILLARY NOTES: **Vital Signs.:   23-Jul-14 13:36  Vital Signs Type Routine  Temperature Temperature (F) 97.7  Celsius 36.5  Temperature Source oral  Pulse Pulse 77  Respirations Respirations 20  Systolic BP Systolic BP 414  Diastolic BP (mmHg) Diastolic BP (mmHg) 91  Mean BP 105  Pulse Ox % Pulse Ox % 94  Pulse Ox Activity Level  At rest  Oxygen Delivery Room Air/ 21 %   Brief Assessment:  Cardiac Regular   Respiratory clear BS   Gastrointestinal details normal protuberant, bs positive normoactive, positive tympany, mild tenderness luq   Lab Results: Hepatic:  23-Jul-14 05:49   Bilirubin, Total 0.7  Alkaline Phosphatase  143  SGPT (ALT) 32  SGOT (AST)  40  Total Protein, Serum 7.0  Albumin, Serum  2.3  Routine Chem:  20-Jul-14 05:35   Lipase 272 (Result(s) reported on 30 Dec 2012 at 06:06AM.)  21-Jul-14 04:56   Lipase 227 (Result(s) reported on 31 Dec 2012 at 06:31AM.)  23-Jul-14 05:49   Lipase 174 (Result(s) reported on 02 Jan 2013 at 06:43AM.)  Glucose, Serum 82  BUN 9  Creatinine (comp) 0.92  Sodium, Serum 136  Potassium, Serum 4.1  Chloride, Serum 104  CO2, Serum 24  Calcium (Total), Serum 8.7  Osmolality (calc) 270  eGFR (African American) >60  eGFR (Non-African American) >60 (eGFR values <24m/min/1.73 m2 may be an indication of chronic kidney disease (CKD). Calculated eGFR is useful in patients with stable renal function. The eGFR calculation will not be reliable in acutely ill patients when serum creatinine is changing rapidly. It is not useful in  patients on dialysis. The eGFR calculation may not be applicable to patients at the low and high extremes of body sizes, pregnant women, and vegetarians.)  Result Comment POTASSIUM/AST  - Slight hemolysis, interpret results with  - caution...tpl  Result(s) reported on 02 Jan 2013 at 06:54AM.  Anion Gap 8  Routine Hem:  20-Jul-14 05:35   WBC (CBC)  13.8  Neutrophil % 85.1  21-Jul-14 04:56   WBC (CBC)  13.2  Neutrophil % 85.5  23-Jul-14 05:49   WBC (CBC)  18.5  RBC (CBC) 4.79  Hemoglobin (CBC) 14.5  Hematocrit (CBC) 42.8  Platelet Count (CBC) 386  MCV 90  MCH 30.3  MCHC 33.9  RDW  14.8  Neutrophil % 89.3  Lymphocyte % 4.0  Monocyte % 5.7  Eosinophil % 0.9  Basophil % 0.1  Neutrophil #  16.6  Lymphocyte #  0.7  Monocyte #  1.1  Eosinophil # 0.2  Basophil # 0.0 (Result(s) reported on 02 Jan 2013 at 06:43AM.)   Assessment/Plan:  Assessment/Plan:  Assessment 1) acute pancreatitis, etoh related.  increased sx after starting regular diets several days ago.   Plan 1) continue current, will check flat and upright films today, r/o mild ileus.  following.   Electronic Signatures: SLoistine Simas(MD)  (Signed 23-Jul-14 15:11)  Authored: Chief Complaint, VITAL SIGNS/ANCILLARY NOTES, Brief Assessment, Lab Results, Assessment/Plan   Last Updated: 23-Jul-14 15:11 by SLoistine Simas(MD)

## 2014-10-03 NOTE — Consult Note (Signed)
Brief Consult Note: Diagnosis: Acute Pancreatitis.   Patient was seen by consultant.   Consult note dictated.   Discussed with Attending MD.   Comments: Patient seen and evaluated. Admitted with acute pancreatitis with elevated lipase >5000 and CT evidence of peripancreatic edema. He was initiated on ice chips today, and states this has not made the pain worse. However, pain and not seemed to improve over the past two days as he is still requesting pain medication as scheduled. No nausea or vomiting. No BM since admittion. No fever or chills. Likely still recovering from acute episode, and we will need to continue to treat with IVF, pain control. Do to persistent pain, we do not recommend advancing his diet quite yet.] Will continue to follow. full consult dictated.  Electronic Signatures: Sherald Barge (PA-C)  (Signed 16-Jul-14 13:20)  Authored: Brief Consult Note   Last Updated: 16-Jul-14 13:20 by Sherald Barge (PA-C)

## 2014-10-03 NOTE — Consult Note (Signed)
Chief Complaint:  Subjective/Chief Complaint seen for pancreatitis.  states pain is 6/10, improving.  continues with abd distension, but passing flatus and bm.  no n/v.  started clears, stable.   VITAL SIGNS/ANCILLARY NOTES: **Vital Signs.:   22-Jul-14 13:37  Vital Signs Type Routine  Temperature Temperature (F) 98.4  Celsius 36.8  Temperature Source oral  Pulse Pulse 76  Respirations Respirations 20  Systolic BP Systolic BP 938  Diastolic BP (mmHg) Diastolic BP (mmHg) 98  Mean BP 117  Pulse Ox % Pulse Ox % 93  Pulse Ox Activity Level  At rest  Oxygen Delivery Room Air/ 21 %   Brief Assessment:  Cardiac Regular   Respiratory clear BS   Gastrointestinal details normal No rebound tenderness  protuberant, bowel sounds less high pitch, mild epigastric and luq tenderness.   Lab Results: Hepatic:  21-Jul-14 04:56   Bilirubin, Total 0.5  Alkaline Phosphatase 122  SGPT (ALT) 37  SGOT (AST)  48  Total Protein, Serum  5.6  Albumin, Serum  2.3  Routine Chem:  14-Jul-14 19:43   Lipase  5443  16-Jul-14 06:03   Lipase  1171 (Result(s) reported on 26 Dec 2012 at 06:56AM.)  17-Jul-14 06:01   Lipase 245 (Result(s) reported on 27 Dec 2012 at 06:46AM.)  19-Jul-14 05:27   Lipase 114 (Result(s) reported on 29 Dec 2012 at 06:17AM.)  20-Jul-14 05:35   Lipase 272 (Result(s) reported on 30 Dec 2012 at [facility].)  21-Jul-14 04:56   Glucose, Serum 77  BUN 13  Creatinine (comp) 0.62  Sodium, Serum 139  Potassium, Serum 4.0  Chloride, Serum 107  CO2, Serum 24  Calcium (Total), Serum  7.9  Osmolality (calc) 276  eGFR (African American) >60  eGFR (Non-African American) >60 (eGFR values <46m/min/1.73 m2 may be an indication of chronic kidney disease (CKD). Calculated eGFR is useful in patients with stable renal function. The eGFR calculation will not be reliable in acutely ill patients when serum creatinine is changing rapidly. It is not useful in  patients on dialysis. The eGFR  calculation may not be applicable to patients at the low and high extremes of body sizes, pregnant women, and vegetarians.)  Result Comment POTASSIUM/AST - Slight hemolysis, interpret results with  - caution.  Result(s) reported on 31 Dec 2012 at 06:18AM.  Anion Gap 8  Lipase 227 (Result(s) reported on 31 Dec 2012 at 06:31AM.)  Routine Hem:  21-Jul-14 04:56   WBC (CBC)  13.2  RBC (CBC)  4.15  Hemoglobin (CBC)  12.8  Hematocrit (CBC)  37.8  Platelet Count (CBC) 312  MCV 91  MCH 30.9  MCHC 33.9  RDW  15.0  Neutrophil % 85.5  Lymphocyte % 5.1  Monocyte % 8.3  Eosinophil % 1.0  Basophil % 0.1  Neutrophil #  11.3  Lymphocyte #  0.7  Monocyte #  1.1  Eosinophil # 0.1  Basophil # 0.0 (Result(s) reported on 31 Dec 2012 at 06:18AM.)   Assessment/Plan:  Assessment/Plan:  Assessment 1) pancreatitis, acute, likely etoh related.  stable, tolerating clears.   Plan 1) would continue clears for 36 hours before advanceing to full liquid.  labs in am.  following.   Electronic Signatures: SLoistine Simas(MD)  (Signed 22-Jul-14 17:52)  Authored: Chief Complaint, VITAL SIGNS/ANCILLARY NOTES, Brief Assessment, Lab Results, Assessment/Plan   Last Updated: 22-Jul-14 17:52 by SLoistine Simas(MD)

## 2014-10-03 NOTE — Consult Note (Signed)
Chief Complaint:  Subjective/Chief Complaint increasedf abdominal pain overnight.  increased nausea, now npo x ice.  passing flatus and some stool earlier today.   VITAL SIGNS/ANCILLARY NOTES: **Vital Signs.:   20-Jul-14 03:49  Vital Signs Type Routine  Temperature Temperature (F) 98.7  Celsius 37  Temperature Source oral  Pulse Pulse 76  Respirations Respirations 18  Systolic BP Systolic BP 767  Diastolic BP (mmHg) Diastolic BP (mmHg) 209  Mean BP 124  Pulse Ox % Pulse Ox % 93  Pulse Ox Activity Level  At rest  Oxygen Delivery Room Air/ 21 %    12:41  Vital Signs Type Pre Medication  Pulse Pulse 86  Respirations Respirations 18  Systolic BP Systolic BP 470  Diastolic BP (mmHg) Diastolic BP (mmHg) 90  Mean BP 109   Brief Assessment:  Cardiac Regular   Respiratory clear BS   Gastrointestinal details normal No rebound tenderness  protuberant, mild tenderness luq/epigastrum., bowel sounds positive.   Lab Results: Hepatic:  20-Jul-14 05:35   Bilirubin, Total 0.4  Alkaline Phosphatase 116  SGPT (ALT) 51  SGOT (AST)  40  Total Protein, Serum 6.8  Albumin, Serum  2.7  Lab:  20-Jul-14 03:40   O2 Saturation (Pulse Ox) 93  FiO2 (Pulse Ox) RA (Result(s) reported on 30 Dec 2012 at 04:17AM.)  Routine Chem:  14-Jul-14 19:43   Lipase  5443  16-Jul-14 06:03   Lipase  1171 (Result(s) reported on 26 Dec 2012 at 06:56AM.)  17-Jul-14 06:01   Lipase 245 (Result(s) reported on 27 Dec 2012 at 06:46AM.)  19-Jul-14 05:27   Lipase 114 (Result(s) reported on 29 Dec 2012 at 06:17AM.)  20-Jul-14 05:35   Glucose, Serum 99  BUN 14  Creatinine (comp) 1.22  Sodium, Serum 139  Potassium, Serum 3.5  Chloride, Serum 105  CO2, Serum 31  Calcium (Total), Serum  8.4  Osmolality (calc) 278  eGFR (African American) >60  eGFR (Non-African American) >60 (eGFR values <110m/min/1.73 m2 may be an indication of chronic kidney disease (CKD). Calculated eGFR is useful in patients with stable  renal function. The eGFR calculation will not be reliable in acutely ill patients when serum creatinine is changing rapidly. It is not useful in  patients on dialysis. The eGFR calculation may not be applicable to patients at the low and high extremes of body sizes, pregnant women, and vegetarians.)  Anion Gap  3  Lipase 272 (Result(s) reported on 30 Dec 2012 at 06:06AM.)  Routine Hem:  19-Jul-14 05:27   WBC (CBC) 10.1  20-Jul-14 05:35   WBC (CBC)  13.8  RBC (CBC) 4.53  Hemoglobin (CBC) 13.7  Hematocrit (CBC) 40.8  Platelet Count (CBC) 365  MCV 90  MCH 30.3  MCHC 33.6  RDW 14.5  Neutrophil % 85.1  Lymphocyte % 4.7  Monocyte % 9.6  Eosinophil % 0.5  Basophil % 0.1  Neutrophil #  11.8  Lymphocyte #  0.6  Monocyte #  1.3  Eosinophil # 0.1  Basophil # 0.0 (Result(s) reported on 30 Dec 2012 at 0[facility])   Radiology Results: CT:    20-Jul-14 11:02, CT Abdomen and Pelvis With Contrast  CT Abdomen and Pelvis With Contrast   REASON FOR EXAM:    (1) abd. pain; (2) pelvic pain  COMMENTS:       PROCEDURE: CT  - CT ABDOMEN / PELVIS  W  - Dec 30 2012 11:02AM     RESULT: History: Abdominal pain    Comparison:  12/24/2012  Technique: Multiple axial images of the abdomen and pelvis were performed   from the lung bases to the pubic symphysis, with p.o. contrast and with   100 ml of Isovue 370 intravenous contrast.    Findings:  There is a small left pleural effusion with compressive atelectasis.   There is a small right pleural effusion.    The liver demonstrates no focal abnormality. There is no intrahepatic or   extrahepatic biliary ductal dilatation. The gallbladder is unremarkable.   The spleen demonstrates no focal abnormality. The kidneys, adrenal glands   are normal. The bladder is unremarkable.     There is severe peripancreatic inflammatory changes consistent with acute   pancreatitis. There is peripancreatic fluid. The pancreas enhances   normally without areas of  nonenhancement to suggest necrosis. There is no   pseudocyst or abscess.    The stomach, duodenum, small intestine, and large intestine demonstrate   no contrast extravasation or dilatation.  There is no pneumoperitoneum,     pneumatosis, or portal venous gas. There is no abdominal or pelvic free   fluid. There is no lymphadenopathy.     The abdominal aorta is normal in caliber .    The osseous structures are unremarkable.    IMPRESSION:     1. There acute pancreatitis without areas of pancreatic necrosis. And the   amount of peripancreatic changes has increased compared with 12/24/2012.    Dictation Site: 1      Verified By: Jennette Banker, M.D., MD   Assessment/Plan:  Assessment/Plan:  Assessment 1)  pancreatitis, acute, etoh related.  Patient with recurrent pancreatitis on restarting regualr diet.  CT change noted.   Plan 1) continue npo x ice, have restarted iv hydration, strict I/O.  will need very slow advancement of diet.  Monitor electrolytes. following.   Electronic Signatures: Loistine Simas (MD)  (Signed 20-Jul-14 18:19)  Authored: Chief Complaint, VITAL SIGNS/ANCILLARY NOTES, Brief Assessment, Lab Results, Radiology Results, Assessment/Plan   Last Updated: 20-Jul-14 18:19 by Loistine Simas (MD)

## 2014-10-03 NOTE — Consult Note (Signed)
Chief Complaint:  Subjective/Chief Complaint seen for pancreatitis, continues with epigastric and luq pain, but improving, no n/v.  tolerating clears.   VITAL SIGNS/ANCILLARY NOTES: **Vital Signs.:   17-Jul-14 04:46  Vital Signs Type Routine  Temperature Temperature (F) 98.9  Celsius 37.1  Temperature Source oral  Pulse Pulse 96  Respirations Respirations 19  Systolic BP Systolic BP 982  Diastolic BP (mmHg) Diastolic BP (mmHg) 78  Mean BP 91  Pulse Ox % Pulse Ox % 91  Pulse Ox Activity Level  At rest  Oxygen Delivery Room Air/ 21 %   Brief Assessment:  Cardiac Regular   Respiratory clear BS   Gastrointestinal details normal No rebound tenderness  obese, mild to moderate tenderness to palpation in the luq.  moderate distension (body habitus per patient)   bowel sounds positive but occasional high pitch.   Lab Results: Routine Chem:  17-Jul-14 06:01   Glucose, Serum 90  BUN 13  Creatinine (comp) 1.00  Sodium, Serum 139  Potassium, Serum  3.4  Chloride, Serum  109  CO2, Serum 23  Calcium (Total), Serum  7.3  Anion Gap 7  Osmolality (calc) 277  eGFR (African American) >60  eGFR (Non-African American) >60 (eGFR values <69m/min/1.73 m2 may be an indication of chronic kidney disease (CKD). Calculated eGFR is useful in patients with stable renal function. The eGFR calculation will not be reliable in acutely ill patients when serum creatinine is changing rapidly. It is not useful in  patients on dialysis. The eGFR calculation may not be applicable to patients at the low and high extremes of body sizes, pregnant women, and vegetarians.)  Lipase 245 (Result(s) reported on 27 Dec 2012 at 06:46AM.)  Routine Hem:  17-Jul-14 06:01   WBC (CBC)  11.4 (Result(s) reported on 27 Dec 2012 at 06:38AM.)   Assessment/Plan:  Assessment/Plan:  Assessment 1) pancreatitis, likely etoh related.  stable/improving.  lipase normalized, some continued abdominal pain though improving.    Plan 1) continue current, possible to start full liquids tomorrow if pain is resolving and he is not needing pain meds.   Electronic Signatures: SLoistine Simas(MD)  (Signed 17-Jul-14 12:58)  Authored: Chief Complaint, VITAL SIGNS/ANCILLARY NOTES, Brief Assessment, Lab Results, Assessment/Plan   Last Updated: 17-Jul-14 12:58 by SLoistine Simas(MD)

## 2014-10-03 NOTE — Discharge Summary (Signed)
PATIENT NAME:  Frank Golden, Frank Golden MR#:  [id number] DATE OF BIRTH:  03-05-1956  DATE OF ADMISSION:  12/24/2012 DATE OF DISCHARGE:  01/04/2013   FINAL DIAGNOSES:  1. Acute pancreatitis.  2. Hypertension.  3. Hyperlipidemia.  4. Hypothyroidism.   HISTORY AND PHYSICAL: Please see dictated admission history and physical.   HOSPITAL COURSE: The patient was admitted with acute abdominal pain and lipase over 5000, consistent with acute pancreatitis, which was confirmed by CT scan. He was placed on IV fluids, bowel rest, pain medications, with improvement in his symptoms. Unfortunately, as his diet was advanced, he developed worsening abdominal pain. CT was repeated, which confirmed worsening of acute pancreatitis. GI was consulted, and they followed the patient and recommended resumption of bowel rest, IV fluids, and very slow advancement of his diet. Using this regimen, his pain did improve. He was taken off IV fluids and monitored over the next 24 hours, changed over to oral medications, and tolerated this as well. On the day of discharge, he was noted to have persistent leukocytosis; however, no fevers, chills, his abdominal pain appears to be improved. We discussed options with the patient, and he did report that he very much wanted to leave the hospital, where he felt he could manage his pain more adequately. To this end, he will be discharged home in stable condition, with his physical activity to be up as tolerated. His diet should be full liquid diet for the next 3 to 4 days, and he may slowly advance this to bland, backing off for any abdominal pain. He will follow up in our office in the next 1 week, but knows to call or return sooner if he has increasing symptoms of discomfort.   DISCHARGE MEDICATIONS:  1. Alprazolam 0.5 mg p.o. t.i.d. p.r.n. anxiety.  2. Celexa 20 mg p.o. daily.  3. Flonase 2 sprays to each nostril daily.  4. Lipitor 80 mg p.o. daily.  5. Bupropion XR 300 mg p.o. daily.  6.  Ondansetron 4 mg p.o. q.6 hours as needed for nausea.  7. Metoprolol 25 mg p.o. b.i.d.  8. Colace 100 mg p.o. b.i.d., hold for loose stools.  9. Diovan 320 mg p.o. daily.  10. Oxycodone 5 mg p.o. q.4 hours p.r.n. severe pain, dispensing 42 with no refills.  11. Levothyroxine 0.112 mg p.o. daily.  12. Carafate 1 gram 4 times a day for 7 days.  13. Prevacid 30 mg p.o. daily.   He should avoid all alcohol in the future as well.   ____________________________ Adin Hector, MD bjk:OSi D: 01/04/2013 08:11:52 ET T: 01/04/2013 08:44:38 ET JOB#: [id number]  cc: Adin Hector, MD, <Dictator> Ramonita Lab MD ELECTRONICALLY SIGNED 01/08/2013 7:33

## 2014-10-03 NOTE — Consult Note (Signed)
Chief Complaint:  Subjective/Chief Complaint increased abdominal pain and distension, now improved after bm   VITAL SIGNS/ANCILLARY NOTES: **Vital Signs.:   18-Jul-14 14:15  Vital Signs Type Routine  Temperature Temperature (F) 98  Celsius 36.6  Temperature Source oral  Pulse Pulse 86  Respirations Respirations 20  Systolic BP Systolic BP 403  Diastolic BP (mmHg) Diastolic BP (mmHg) 90  Mean BP 113  Pulse Ox % Pulse Ox % 92  Pulse Ox Activity Level  At rest  Oxygen Delivery Room Air/ 21 %  *Intake and Output.:   18-Jul-14 08:00  Percentage of Meal Eaten  100    12:00  Percentage of Meal Eaten  100    16:40  Percentage of Meal Eaten  100   Brief Assessment:  Cardiac Regular   Respiratory clear BS   Gastrointestinal details normal positive abdominal distension, protuberance, bs positive, minimal tenderness   Lab Results: Routine Chem:  14-Jul-14 19:43   Lipase  5443  16-Jul-14 06:03   Lipase  1171 (Result(s) reported on 26 Dec 2012 at 06:56AM.)  17-Jul-14 06:01   Lipase 245 (Result(s) reported on 27 Dec 2012 at 06:46AM.)  Routine Hem:  14-Jul-14 19:43   WBC (CBC)  15.6  15-Jul-14 06:18   WBC (CBC)  16.0  16-Jul-14 06:03   WBC (CBC)  15.2  17-Jul-14 06:01   WBC (CBC)  11.4 (Result(s) reported on 27 Dec 2012 at 06:38AM.)   Radiology Results: [place]:    15-Jul-14 11:29, US Abdomen General Survey  US Abdomen General Survey   REASON FOR EXAM:    pancreatitis  COMMENTS:       PROCEDURE: [place]  - US ABDOMEN GENERAL SURVEY  - Dec 25 2012 11:29AM     RESULT: Abdominal sonogram is performed. The pancreas could not be   visualized because of overlying bowel gas. There is limited visualization   of the spleen come inferior vena cava and aorta. The proximal inferior   vena cava appears patent. No abdominal aortic aneurysm is appreciated.   There is no evidence of ascites. The liver is hyperechoic an extremely   echodense. There is no ductal dilation or mass evident. The  liver length   is 15.55 cm. There are no gallstones evident. The gallbladder wall   thickness is 1.7 to 1.9 mm. The common bile duct diameter is 5.4 mm which   is within normal limits for the patient's age. There is a negative   sonographic Murphy's sign. The right kidney measures 11.86 x 6.54 x 5.90     cm. The spleen measures up to 8.82 cm. The left kidney measures 12.14 x   6.72 x 6.26 cm. There no gallstones, renal stones or urinary obstructive   change is evident.    IMPRESSION:  Limited study because the patient's body habitus and poor   visualization of the pancreas, spleen, aorta and inferior vena cava.   Fatty infiltration of the liver. No evidence of cholelithiasis or acute   cholecystitis.    Dictation Site: 2        Verified By: Sundra Aland, M.D., MD  CT:    14-Jul-14 21:36, CT Abdomen and Pelvis With Contrast  CT Abdomen and Pelvis With Contrast   REASON FOR EXAM:    (1) pancreatitis no po contrast; (2) pancreatitis no   po contrast  COMMENTS:       PROCEDURE: CT  - CT ABDOMEN / PELVIS  W  - Dec 24 2012  9:36PM  RESULT: Axial CT scanning was performed through the abdomen and pelvis   with reconstructions at 3 mm intervals and slice thicknesses. Review of   multiplanar reconstructed images was performed separately on the VIA   monitor.    The liver exhibits decreased density diffusely consistent with fatty   infiltration. There is no focal mass nor ductal dilation. The gallbladder   is adequately distended with no evidence of stones or wall thickening or   pericholecystic fluid. There is inflammatory change in the fat     surrounding the pancreas. There is no discrete pseudocyst. Thepancreas   itself appears edematous consistent with acute pancreatitis. Inflammatory   change involving the wall of the second and third portions of the   duodenum is present. The stomach is nondistended. There is a small hiatal   hernia. The unopacified loops of jejunum  and ileum exhibit no evidence of   ileus nor of obstruction.     The spleen is not enlarged. There are no adrenal masses. The kidneys   enhance well with no evidence of obstruction or inflammatory change. The   caliber of the abdominal aorta is normal. There is no periaortic nor   pericaval lymphadenopathy.    The urinary bladder, prostate gland, and seminal vesicles are normal in   appearance. There is no  inguinal hernia. There is a tiny umbilical   hernia containing only fat.  The lumbar vertebral bodies are preserved in height. The lung bases   exhibit atelectasis posteriorly.    IMPRESSION:   1. The appearance of the pancreas and surrounding retroperitoneal is   consistent with acute pancreatitis. There is edema of the second and   third portions of the duodenum. There is fluid in the paracolic gutters   bilaterally. There is no evidence of a pseudocyst.  2. There are fatty infiltrative changes of the liver. The gallbladder and   spleen appear normal.  3. The stomach is nondistended. There is no evidence of a small or large   bowel obstruction nor ileus.  4. There is no acute urinary tract abnormality.  5. There are bibasilar atelectatic changes in the lungs.   Dictation Site: 5        Verified By: DAVID A. Martinique, M.D., MD   Assessment/Plan:  Assessment/Plan:  Assessment 1) acute pancreatitis, improving, less abdominal pain normalized lipase.  continues with abdominal distension, partly body habitus per patient.   Plan 1) continue current, will check 2 way abd films in am, r/o ileus.   Electronic Signatures: Loistine Simas (MD)  (Signed 18-Jul-14 22:23)  Authored: Chief Complaint, VITAL SIGNS/ANCILLARY NOTES, Brief Assessment, Lab Results, Radiology Results, Assessment/Plan   Last Updated: 18-Jul-14 22:23 by Loistine Simas (MD)

## 2014-10-03 NOTE — Consult Note (Signed)
Chief Complaint:  Subjective/Chief Complaint Patient seen and examined, please see full GI consult.  [place] admitted with n/v and abdominal pain, found with pancreatitis.  Patient denies previous h/o same.  REview of history  consistant with recent increase etoh and fatty foods.  Triglycerides low/normal  and no eivdence to support biliary source.  Discussed with patient.  He still has significant level of lipase in the setting of normal creatinine, although some elevation of creatinine.  Likely slow resolution.  Continue current with iv hydration, pain control and would not advance diet yet, continue ice chips. Following.   VITAL SIGNS/ANCILLARY NOTES: **Vital Signs.:   16-Jul-14 13:08  Vital Signs Type Routine  Temperature Temperature (F) 97.5  Celsius 36.3  Temperature Source oral  Pulse Pulse 100  Respirations Respirations 19  Systolic BP Systolic BP 941  Diastolic BP (mmHg) Diastolic BP (mmHg) 84  Mean BP 97  Pulse Ox % Pulse Ox % 91  Pulse Ox Activity Level  At rest  Oxygen Delivery Room Air/ 21 %  *Intake and Output.:   16-Jul-14 14:05  Stool  Patient stated that he had a bowel movement.   Brief Assessment:  Cardiac Regular   Respiratory clear BS   Gastrointestinal details normal some distension with tympany, normal bowel sounds.   Radiology Results: [place]:    15-Jul-14 11:29, US Abdomen General Survey  US Abdomen General Survey   REASON FOR EXAM:    pancreatitis  COMMENTS:       PROCEDURE: [place]  - US ABDOMEN GENERAL SURVEY  - Dec 25 2012 11:29AM     RESULT: Abdominal sonogram is performed. The pancreas could not be   visualized because of overlying bowel gas. There is limited visualization   of the spleen come inferior vena cava and aorta. The proximal inferior   vena cava appears patent. No abdominal aortic aneurysm is appreciated.   There is no evidence of ascites. The liver is hyperechoic an extremely   echodense. There is no ductal dilation or mass evident. The  liver length   is 15.55 cm. There are no gallstones evident. The gallbladder wall   thickness is 1.7 to 1.9 mm. The common bile duct diameter is 5.4 mm which   is within normal limits for the patient's age. There is a negative   sonographic Murphy's sign. The right kidney measures 11.86 x 6.54 x 5.90     cm. The spleen measures up to 8.82 cm. The left kidney measures 12.14 x   6.72 x 6.26 cm. There no gallstones, renal stones or urinary obstructive   change is evident.    IMPRESSION:  Limited study because the patient's body habitus and poor   visualization of the pancreas, spleen, aorta and inferior vena cava.   Fatty infiltration of the liver. No evidence of cholelithiasis or acute   cholecystitis.    Dictation Site: 2        Verified By: Sundra Aland, M.D., MD  CT:    14-Jul-14 21:36, CT Abdomen and Pelvis With Contrast  CT Abdomen and Pelvis With Contrast   REASON FOR EXAM:    (1) pancreatitis no po contrast; (2) pancreatitis no   po contrast  COMMENTS:       PROCEDURE: CT  - CT ABDOMEN / PELVIS  W  - Dec 24 2012  9:36PM     RESULT: Axial CT scanning was performed through the abdomen and pelvis   with reconstructions at 3 mm intervals and slice thicknesses. Review  of   multiplanar reconstructed images was performed separately on the VIA   monitor.    The liver exhibits decreased density diffusely consistent with fatty   infiltration. There is no focal mass nor ductal dilation. The gallbladder   is adequately distended with no evidence of stones or wall thickening or   pericholecystic fluid. There is inflammatory change in the fat     surrounding the pancreas. There is no discrete pseudocyst. Thepancreas   itself appears edematous consistent with acute pancreatitis. Inflammatory   change involving the wall of the second and third portions of the   duodenum is present. The stomach is nondistended. There is a small hiatal   hernia. The unopacified loops of jejunum  and ileum exhibit no evidence of   ileus nor of obstruction.     The spleen is not enlarged. There are no adrenal masses. The kidneys   enhance well with no evidence of obstruction or inflammatory change. The   caliber of the abdominal aorta is normal. There is no periaortic nor   pericaval lymphadenopathy.    The urinary bladder, prostate gland, and seminal vesicles are normal in   appearance. There is no  inguinal hernia. There is a tiny umbilical   hernia containing only fat.  The lumbar vertebral bodies are preserved in height. The lung bases   exhibit atelectasis posteriorly.    IMPRESSION:   1. The appearance of the pancreas and surrounding retroperitoneal is   consistent with acute pancreatitis. There is edema of the second and   third portions of the duodenum. There is fluid in the paracolic gutters   bilaterally. There is no evidence of a pseudocyst.  2. There are fatty infiltrative changes of the liver. The gallbladder and   spleen appear normal.  3. The stomach is nondistended. There is no evidence of a small or large   bowel obstruction nor ileus.  4. There is no acute urinary tract abnormality.  5. There are bibasilar atelectatic changes in the lungs.   Dictation Site: 5        Verified By: DAVID A. Martinique, M.D., MD   Assessment/Plan:  Assessment/Plan:  Assessment 1) acute pancreatitis, likely etoh related.  opassing flatus and stool, continues with abdominal pain, labs improving.  Continue current.  Would not advance a diet yet.  Following.   Electronic Signatures: Loistine Simas (MD)  (Signed 16-Jul-14 15:41)  Authored: Chief Complaint, VITAL SIGNS/ANCILLARY NOTES, Brief Assessment, Radiology Results, Assessment/Plan   Last Updated: 16-Jul-14 15:41 by Loistine Simas (MD)

## 2014-10-03 NOTE — Consult Note (Signed)
Chief Complaint:  Subjective/Chief Complaint seen for acute pancreatitis.  less symptomati complaints today, seems to be tolerating regular diet po though waffles with syrup this am made him nauseated.  Currently no nausea, no vomniting no abdominalpain.   VITAL SIGNS/ANCILLARY NOTES: **Vital Signs.:   19-Jul-14 04:13  Vital Signs Type Routine  Temperature Temperature (F) 97.4  Celsius 36.3  Temperature Source oral  Pulse Pulse 50  Respirations Respirations 20  Systolic BP Systolic BP 607  Diastolic BP (mmHg) Diastolic BP (mmHg) 99  Mean BP 115  Pulse Ox % Pulse Ox % 94  Pulse Ox Activity Level  At rest  Oxygen Delivery Room Air/ 21 %    14:15  Vital Signs Type Routine  Temperature Temperature (F) 97.9  Celsius 36.6  Temperature Source oral  Pulse Pulse 80  Respirations Respirations 18  Systolic BP Systolic BP 371  Diastolic BP (mmHg) Diastolic BP (mmHg) 91  Mean BP 117  Pulse Ox % Pulse Ox % 98  Pulse Ox Activity Level  At rest  Oxygen Delivery Room Air/ 21 %   Brief Assessment:  Cardiac Regular   Respiratory clear BS   Gastrointestinal details normal Bowel sounds normal  No rebound tenderness  protuberant, mild tympani, minimal tenderness throughout.   Lab Results: Hepatic:  19-Jul-14 05:27   Bilirubin, Total 0.4  Alkaline Phosphatase 123  SGPT (ALT) 46  SGOT (AST)  47  Total Protein, Serum 6.8  Albumin, Serum  2.5  Routine Chem:  14-Jul-14 19:43   Lipase  5443  16-Jul-14 06:03   Lipase  1171 (Result(s) reported on 26 Dec 2012 at 06:56AM.)  17-Jul-14 06:01   Lipase 245 (Result(s) reported on 27 Dec 2012 at 06:46AM.)  19-Jul-14 05:27   Glucose, Serum  146  BUN 11  Creatinine (comp) 1.04  Sodium, Serum 136  Potassium, Serum  3.3  Chloride, Serum 102  CO2, Serum 30  Calcium (Total), Serum  8.4  Osmolality (calc) 274  eGFR (African American) >60  eGFR (Non-African American) >60 (eGFR values <34m/min/1.73 m2 may be an indication of chronic kidney  disease (CKD). Calculated eGFR is useful in patients with stable renal function. The eGFR calculation will not be reliable in acutely ill patients when serum creatinine is changing rapidly. It is not useful in  patients on dialysis. The eGFR calculation may not be applicable to patients at the low and high extremes of body sizes, pregnant women, and vegetarians.)  Anion Gap  4  Lipase 114 (Result(s) reported on 29 Dec 2012 at 06:17AM.)  Routine Hem:  19-Jul-14 05:27   WBC (CBC) 10.1  RBC (CBC)  4.05  Hemoglobin (CBC)  12.5  Hematocrit (CBC)  36.6  Platelet Count (CBC) 339  MCV 90  MCH 30.8  MCHC 34.2  RDW  14.6  Neutrophil % 88.5  Lymphocyte % 5.9  Monocyte % 5.4  Eosinophil % 0.1  Basophil % 0.1  Neutrophil #  9.0  Lymphocyte #  0.6  Monocyte # 0.5  Eosinophil # 0.0  Basophil # 0.0 (Result(s) reported on 29 Dec 2012 at 06:13AM.)   Radiology Results: XRay:    18-Jul-14 22:49, Abdomen Flat and Erect  Abdomen Flat and Erect   REASON FOR EXAM:    abdominal distension, r/o ileus  COMMENTS:       PROCEDURE: DXR - DXR ABDOMEN 2 V FLAT AND ERECT  - Dec 28 2012 10:49PM     RESULT: Comparisons:  None    Findings:  Supine and upright views of the abdomen are provided.    There is gaseous distention of small bowel and colon with air-fluid   levels seen in both the colon and small bowel. There is no pathologic   calcification along the expected course of the ureters. There is no   evidence of pneumoperitoneum, portal venousgas, or pneumatosis.  The osseous structures are unremarkable.    IMPRESSION:     Findings most concerning for an ileus.    Dictation Site: 1        Verified By: Jennette Banker, M.D., MD    19-Jul-14 08:41, Abdomen Complete 3 or more views of abd  Abdomen Complete 3 or more views of abd   REASON FOR EXAM:    abd. pain  COMMENTS:       PROCEDURE: DXR - DXR ABDOMEN COMPLETE  - Dec 29 2012  8:41AM     RESULT: Comparisons:   None    Findings:      PA chest and supine, upright, and left lateral decubitus views of the   abdomen are provided.    The chest is clear. The heart and mediastinum are unremarkable. The   osseous structures are unremarkable.    There is a nonspecific bowel gas pattern. There is no bowel dilatation to   suggest obstruction. There are no air-fluid levels. There are no dilated   loops, bowel wall thickening, or evidence of mass effect.  Soft tissue   shadows are normal. There is no evidence of pneumoperitoneum, portal   venous gas, or pneumatosis.    Osseous structures are unremarkable.    IMPRESSION:     Unremarkable abdominal series.    Dictation Site: 1      Verified By: Jennette Banker, M.D., MD   Assessment/Plan:  Assessment/Plan:  Assessment 1) acute pancreatitis, etoh related.  Currently tolerating po, no n/v.  abd films today show ileus resolved.  lipase well normalized.   Plan 1) if no other issues consider d/c.  Will sign off, reconsult as needed.   Electronic Signatures: Loistine Simas (MD)  (Signed 19-Jul-14 19:37)  Authored: Chief Complaint, VITAL SIGNS/ANCILLARY NOTES, Brief Assessment, Lab Results, Radiology Results, Assessment/Plan   Last Updated: 19-Jul-14 19:37 by Loistine Simas (MD)

## 2014-10-03 NOTE — H&P (Signed)
PATIENT NAME:  Frank Golden, Frank Golden MR#:  [id number] DATE OF BIRTH:  1955/09/10  DATE OF ADMISSION:  12/24/2012  REFERRING PHYSICIAN: Dr. Conni Slipper.   PRIMARY CARE PHYSICIAN: Dr. Ramonita Lab.   CHIEF COMPLAINT: Abdominal pain.   HISTORY OF PRESENT ILLNESS: Frank Golden is a 59 year old, pleasant, white male with a history of hypertension, hyperlipidemia, hypothyroidism who returned from vacation yesterday. The patient has been eating fatty food and has been consuming alcohol. Last night, woke up in the middle of the night with severe abdominal pain in the epigastric area. Since then, has been having multiple episodes of vomiting. Could not keep down any food. Denies having any diarrhea. Denies having any fever. The patient states the pain radiates to the back. Workup in the Emergency Department, the patient was found to have a lipase of 5443. CT abdomen and pelvis done in the Emergency Department suggestive of acute pancreatitis. Gallbladder appeared normal. The patient is currently receiving IV fluids. After receiving 1 dose of morphine, the patient had significant improvement of the abdominal pain.   PAST MEDICAL HISTORY:  1. Hypertension.  2. Hyperlipidemia.  3. Hypothyroidism.  4. Sleep apnea.   PAST SURGICAL HISTORY:  1. Appendectomy.  2. Back surgery.  3. Skin cancer removal.   ALLERGIES: BEE STINGS.   HOME MEDICATIONS:  1. Prevacid 30 mg once a day.  2. Lipitor 80 mg once a day.  3. Flonase 50 mcg 2 sprays once a day.  4. Diovan 160 mg once a day.  5. Celexa 20 mg once a day.  6. Bupropion 300 mg once a day.  7. Benadryl 25 mg once a day.  8. Alprazolam 0.5 mg 1 tablet 3 times a day.   SOCIAL HISTORY: No history of smoking. Drinks on a regular day 3 to 4 beers a day. Unemployed.   FAMILY HISTORY: Father with hypertension, hyperlipidemia, cancers, also had MI and CVA. Died from abdominal aortic aneurysm rupture.    REVIEW OF SYSTEMS:  CONSTITUTIONAL: No generalized weakness.   EYES: No change in vision.  ENT: No change in hearing, sore throat or runny nose.  RESPIRATORY: No cough, shortness of breath.  CARDIOVASCULAR: No chest pain, palpitations.  GASTROINTESTINAL: Has nausea, vomiting, abdominal pain.  GENITOURINARY: No dysuria or hematuria.   SKIN: No rash or lesions.  MUSCULOSKELETAL: No joint pains and aches.  NEUROLOGIC: No numbness or weakness in any part of the body.   PHYSICAL EXAMINATION:  GENERAL: This is a well-built, well-nourished, age-appropriate male lying down in the bed, not in distress.  VITAL SIGNS: Temperature 96.6, pulse 90, blood pressure 152/74, respiratory rate of 18, oxygen saturation is 97% on room air.  HEENT: Head normocephalic, atraumatic. There is no scleral icterus. Conjunctivae normal. Pupils equal and react to light. Extraocular movements are intact. Mucous membranes moist. No pharyngeal erythema.   NECK: Supple. No lymphadenopathy. No JVD. No carotid bruit.  CHEST: Has no focal tenderness.  LUNGS: Bilaterally clear to auscultation.  HEART: S1 and S2 regular. No murmurs are heard. Tachycardia. No pedal edema. Pulses 2+.   ABDOMEN: Bowel sounds present. Soft. Has tenderness in the epigastric area. No rebound or guarding. Could not examine for hepatosplenomegaly.  EXTREMITIES: No pedal edema. Pulses 2+.  SKIN: No rash or lesions.  MUSCULOSKELETAL: No joint pains and aches. No swelling.  NEUROLOGIC: The patient is alert, oriented to place, person and time. Cranial nerves II through XII intact. Motor 5/5 in upper and lower extremities.   LABS: CMP is completely within normal  limits except for potassium of 3.3. . Troponin less than 0.02. CBC: WBC of 15.6, hemoglobin 16, platelet count of 283.   CT abdomen and pelvis as mentioned above shows peripancreatic edema.   ASSESSMENT AND PLAN: Frank Golden is a 59 year old male with known history of alcohol use, comes to the Emergency Department with acute pancreatitis.  1. Acute  pancreatitis: Most likely alcohol induced. CT abdomen and pelvis does not indicate the patient has any cholelithiasis. The patient has normal LFTs. Continue with intravenous. Keep the patient n.p.o. Pain management as needed. Will also obtain lipid profile to ensure hypertriglyceridemia is not contributing to this acute pancreatitis.  2. Hypertension: Continue with the home medications.  3. Hypothyroidism: Continue Synthroid.  4. Keep the patient on deep vein thrombosis prophylaxis with Lovenox.   TIME SPENT: 40 minutes.    ____________________________ Frank Becton, MD pv:gb D: 12/25/2012 00:10:43 ET T: 12/25/2012 03:58:29 ET JOB#: [id number]  cc: Frank Becton, MD, <Dictator> Adin Hector, MD Frank Becton MD ELECTRONICALLY SIGNED 01/20/2013 21:34

## 2014-10-03 NOTE — Consult Note (Signed)
Chief Complaint:  Subjective/Chief Complaint seen for pancreatitis, possible related to etoh use.  feeling some better today.   no nausea, mild abdominal discomfort.   VITAL SIGNS/ANCILLARY NOTES: **Vital Signs.:   21-Jul-14 13:47  Vital Signs Type Routine  Temperature Temperature (F) 98.2  Celsius 36.7  Temperature Source oral  Pulse Pulse 70  Respirations Respirations 20  Systolic BP Systolic BP 155  Diastolic BP (mmHg) Diastolic BP (mmHg) 92  Mean BP 111  Pulse Ox % Pulse Ox % 93  Pulse Ox Activity Level  At rest  Oxygen Delivery Non-invasive ventilation (CPAP/BIPAP)   Brief Assessment:  Cardiac Regular   Respiratory clear BS   Gastrointestinal details normal protuberant, mild distension, high pitch bowel sounds, no rebound, diffuse discomfort.   Lab Results: Hepatic:  21-Jul-14 04:56   Bilirubin, Total 0.5  Alkaline Phosphatase 122  SGPT (ALT) 37  SGOT (AST)  48  Total Protein, Serum  5.6  Albumin, Serum  2.3  Routine Chem:  14-Jul-14 19:43   Lipase  5443  16-Jul-14 06:03   Lipase  1171 (Result(s) reported on 26 Dec 2012 at 06:56AM.)  17-Jul-14 06:01   Lipase 245 (Result(s) reported on 27 Dec 2012 at 06:46AM.)  19-Jul-14 05:27   Lipase 114 (Result(s) reported on 29 Dec 2012 at 06:17AM.)  20-Jul-14 05:35   Lipase 272 (Result(s) reported on 30 Dec 2012 at [facility].)  21-Jul-14 04:56   Glucose, Serum 77  BUN 13  Creatinine (comp) 0.62  Sodium, Serum 139  Potassium, Serum 4.0  Chloride, Serum 107  CO2, Serum 24  Calcium (Total), Serum  7.9  Osmolality (calc) 276  eGFR (African American) >60  eGFR (Non-African American) >60 (eGFR values <29m/min/1.73 m2 may be an indication of chronic kidney disease (CKD). Calculated eGFR is useful in patients with stable renal function. The eGFR calculation will not be reliable in acutely ill patients when serum creatinine is changing rapidly. It is not useful in  patients on dialysis. The eGFR calculation may not be  applicable to patients at the low and high extremes of body sizes, pregnant women, and vegetarians.)  Result Comment POTASSIUM/AST - Slight hemolysis, interpret results with  - caution.  Result(s) reported on 31 Dec 2012 at 06:18AM.  Anion Gap 8  Lipase 227 (Result(s) reported on 31 Dec 2012 at 06:31AM.)  Routine Hem:  21-Jul-14 04:56   WBC (CBC)  13.2  RBC (CBC)  4.15  Hemoglobin (CBC)  12.8  Hematocrit (CBC)  37.8  Platelet Count (CBC) 312  MCV 91  MCH 30.9  MCHC 33.9  RDW  15.0  Neutrophil % 85.5  Lymphocyte % 5.1  Monocyte % 8.3  Eosinophil % 1.0  Basophil % 0.1  Neutrophil #  11.3  Lymphocyte #  0.7  Monocyte #  1.1  Eosinophil # 0.1  Basophil # 0.0 (Result(s) reported on 31 Dec 2012 at 06:18AM.)   Radiology Results: CT:    20-Jul-14 11:02, CT Abdomen and Pelvis With Contrast  CT Abdomen and Pelvis With Contrast   REASON FOR EXAM:    (1) abd. pain; (2) pelvic pain  COMMENTS:       PROCEDURE: CT  - CT ABDOMEN / PELVIS  W  - Dec 30 2012 11:02AM     RESULT: History: Abdominal pain    Comparison:  12/24/2012    Technique: Multiple axial images of the abdomen and pelvis were performed   from the lung bases to the pubic symphysis, with p.o. contrast and with  100 ml of Isovue 370 intravenous contrast.    Findings:  There is a small left pleural effusion with compressive atelectasis.   There is a small right pleural effusion.    The liver demonstrates no focal abnormality. There is no intrahepatic or   extrahepatic biliary ductal dilatation. The gallbladder is unremarkable.   The spleen demonstrates no focal abnormality. The kidneys, adrenal glands   are normal. The bladder is unremarkable.     There is severe peripancreatic inflammatory changes consistent with acute   pancreatitis. There is peripancreatic fluid. The pancreas enhances   normally without areas of nonenhancement to suggest necrosis. There is no   pseudocyst or abscess.    The stomach,  duodenum, small intestine, and large intestine demonstrate   no contrast extravasation or dilatation.  There is no pneumoperitoneum,     pneumatosis, or portal venous gas. There is no abdominal or pelvic free   fluid. There is no lymphadenopathy.     The abdominal aorta is normal in caliber .    The osseous structures are unremarkable.    IMPRESSION:     1. There acute pancreatitis without areas of pancreatic necrosis. And the   amount of peripancreatic changes has increased compared with 12/24/2012.    Dictation Site: 1      Verified By: Jennette Banker, M.D., MD   Assessment/Plan:  Assessment/Plan:  Assessment 1) pancreatitis related to etoh use.  some recrudescence with advance of diet.  Now npo x ice, feeling some bettter.   Plan 1) continue current, will let level of pain med use help with when to start diet.  Daily lipase and cbc.  following.   Electronic Signatures: Loistine Simas (MD)  (Signed 21-Jul-14 19:11)  Authored: Chief Complaint, VITAL SIGNS/ANCILLARY NOTES, Brief Assessment, Lab Results, Radiology Results, Assessment/Plan   Last Updated: 21-Jul-14 19:11 by Loistine Simas (MD)

## 2014-10-28 ENCOUNTER — Ambulatory Visit
Admission: RE | Admit: 2014-10-28 | Discharge: 2014-10-28 | Disposition: A | Discharge status: Home / Self Care | Payer: Self-pay | Source: Ambulatory Visit | Attending: Internal Medicine | Admitting: Internal Medicine

## 2014-10-28 ENCOUNTER — Other Ambulatory Visit: Payer: Self-pay | Admitting: Internal Medicine

## 2014-10-28 DIAGNOSIS — R0602 Shortness of breath: Secondary | ICD-10-CM

## 2014-10-28 DIAGNOSIS — R05 Cough: Secondary | ICD-10-CM

## 2014-10-28 DIAGNOSIS — R059 Cough, unspecified: Secondary | ICD-10-CM

## 2014-11-11 ENCOUNTER — Ambulatory Visit: Payer: Self-pay

## 2014-11-11 ENCOUNTER — Ambulatory Visit: Payer: Self-pay | Admitting: Internal Medicine

## 2014-11-27 ENCOUNTER — Ambulatory Visit (INDEPENDENT_AMBULATORY_CARE_PROVIDER_SITE_OTHER): Payer: Self-pay | Admitting: Internal Medicine

## 2014-11-27 ENCOUNTER — Encounter: Payer: Self-pay | Admitting: Internal Medicine

## 2014-11-27 VITALS — BP 132/92 | HR 95 | Ht 68.0 in | Wt 264.0 lb

## 2014-11-27 DIAGNOSIS — E669 Obesity, unspecified: Secondary | ICD-10-CM

## 2014-11-27 DIAGNOSIS — J438 Other emphysema: Secondary | ICD-10-CM

## 2014-11-27 DIAGNOSIS — G4733 Obstructive sleep apnea (adult) (pediatric): Secondary | ICD-10-CM

## 2014-11-27 DIAGNOSIS — Z9989 Dependence on other enabling machines and devices: Secondary | ICD-10-CM

## 2014-11-27 LAB — PULMONARY FUNCTION TEST
DL/VA % PRED: 128 %
DL/VA: 5.78 ml/min/mmHg/L
DLCO unc % pred: 117 %
DLCO unc: 34.91 ml/min/mmHg
FEF 25-75 PRE: 2.95 L/s
FEF 25-75 Post: 3.66 L/sec
FEF2575-%CHANGE-POST: 24 %
FEF2575-%Pred-Post: 129 %
FEF2575-%Pred-Pre: 103 %
FEV1-%Change-Post: 5 %
FEV1-%Pred-Post: 96 %
FEV1-%Pred-Pre: 91 %
FEV1-POST: 3.28 L
FEV1-Pre: 3.1 L
FEV1FVC-%CHANGE-POST: 2 %
FEV1FVC-%PRED-PRE: 104 %
FEV6-%CHANGE-POST: 3 %
FEV6-%PRED-PRE: 91 %
FEV6-%Pred-Post: 94 %
FEV6-POST: 4.03 L
FEV6-Pre: 3.91 L
FEV6FVC-%PRED-POST: 104 %
FEV6FVC-%Pred-Pre: 104 %
FVC-%CHANGE-POST: 3 %
FVC-%PRED-POST: 90 %
FVC-%Pred-Pre: 87 %
FVC-PRE: 3.91 L
FVC-Post: 4.03 L
PRE FEV6/FVC RATIO: 100 %
Post FEV1/FVC ratio: 81 %
Post FEV6/FVC ratio: 100 %
Pre FEV1/FVC ratio: 79 %
RV % pred: 119 %
RV: 2.52 L
TLC % pred: 100 %
TLC: 6.65 L

## 2014-11-27 MED ORDER — FLUTICASONE FUROATE-VILANTEROL 200-25 MCG/INH IN AEPB: 200.0000 ug | INHALATION_SPRAY | Freq: Every day | RESPIRATORY_TRACT | Status: DC

## 2014-11-27 NOTE — Progress Notes (Signed)
SMW performed today. 

## 2014-11-27 NOTE — Patient Instructions (Signed)
Follow up with Dr. Stevenson Clinch in 3 months - we will give refills for [facility] - 12 refills, since you were approved. - cont with diet, exercise and wt. Loss - if side effects of Breo (anxiety/depression) continue then call us back, we may have to switch to another inhaler.

## 2014-11-27 NOTE — Progress Notes (Signed)
PFT performed today. 

## 2014-11-27 NOTE — Progress Notes (Signed)
MRN# [id number] Frank Golden 1955/08/21   CC: Chief Complaint  Patient presents with  . Follow-up    PFT/SMW/CXR results/ ?about Memory Dance, ?side effects      Brief History: HPI 09/2014 Patient is a 59 year old male presenting today for dyspnea on exertion for the past 6 months. History per the patient, he states that he had these complaints with his regular doctor and he was seen by Dr. Caryl Comes, a stress test was ordered, during a stress test to develop severe shortness of breath, wheezing and chest tightness, stress test was stopped immediately and patient took about 5 minutes to calm down. After 5 minutes he completed a stress test for which she was told it was completely normal. He describes dyspnea on exertion as chronic especially with doing yardwork even after 5 minutes, he used albuterol about 5 years ago and had some relief from it. He also endorses a chronic cough mostly in the mornings. In the last 5 years he states he may have had steroids for suspected upper spray tract infections 1-2 times. He also states his history of obstructive sleep apnea, he uses CPAP machine 7 nights a week for about 8 hours per night. In the past he had also been placed on Flovent for suspected asthma or allergies. He does endorse a history of smoking quitting in 2000, previous to smoke a pack a day for 15 years. 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 Patient was diagnosed with obstructive sleep apnea about 6 years ago and he's been on CPAP since then. Plan - 23mwt, pfts, diet exercise, Breo, cont with cpap.  Events since last clinic visit: Density for follow-up visit of dyspnea on exertion. At his last visit he was critically suspected of having COPD. At that visit he was also placed on a trial of Breo. He states that after about 2 days of beer he noticed significant improvement in his dyspnea on exertion, however since then it has leveled off. He still endorses some mild  dyspnea on exertion, but does have moderate improvement. He is a non-smoker currently, has a past history of smoking. States that he is wearing his CPAP nightly, unsure of his exact pressures, but his primary care physician Dr. Caryl Comes is managing his obstructive sleep apnea. Patient also stated since starting Breo he has noticed some personality changes, mood swings may include anxiety or feeling depressed at times; he's not sure this is related to [facility] or other stressors that are occurring currently.   Today patient also had a pulmonary function testing and 6 minute walk test.    Medication:   Current Outpatient Rx  Name  Route  Sig  Dispense  Refill  . albuterol (PROVENTIL HFA;VENTOLIN HFA) 108 (90 BASE) MCG/ACT inhaler   Inhalation   Inhale 2 puffs into the lungs as needed.         . ALPRAZolam (XANAX) 1 MG tablet   Oral   Take 1 mg by mouth 3 (three) times daily as needed.         . Coenzyme Q10 (COQ-10) 100 MG capsule   Oral   Take 100 mg by mouth daily. With red yeast rice.         Marland Kitchen EPINEPHrine 0.3 mg/0.3 mL IJ SOAJ injection   Intramuscular   Inject 0.3 mg into the muscle as needed.         . fluticasone (FLONASE) 50 MCG/ACT nasal spray   Nasal   Place 2 sprays  into the nose daily.         . Fluticasone Furoate-Vilanterol (BREO ELLIPTA) 200-25 MCG/INH AEPB   Inhalation   Inhale 200 mcg into the lungs daily.   28 each   12   . hydrochlorothiazide (HYDRODIURIL) 25 MG tablet   Oral   Take 25 mg by mouth daily.         Marland Kitchen levothyroxine (SYNTHROID, LEVOTHROID) 112 MCG tablet   Oral   Take 112 mcg by mouth daily.         . OMEGA-3 FATTY ACIDS PO   Oral   Take 1 capsule by mouth daily.         . saw palmetto 160 MG capsule   Oral   Take 1 capsule by mouth daily.         . valsartan (DIOVAN) 320 MG tablet   Oral   Take 320 mg by mouth daily.         . vitamin E 1000 UNIT capsule   Oral   Take 1 capsule by mouth daily.             Review of Systems: Gen:  Denies  fever, sweats, chills HEENT: Denies blurred vision, double vision, ear pain, eye pain, hearing loss, nose bleeds, sore throat Cvc:  No dizziness, chest pain or heaviness Resp:   Admits to: Mild dyspnea on exertion Gi: Denies swallowing difficulty, stomach pain, nausea or vomiting, diarrhea, constipation, bowel incontinence Gu:  Denies bladder incontinence, burning urine Ext:   No Joint pain, stiffness or swelling Skin: No skin rash, easy bruising or bleeding or hives Endoc:  No polyuria, polydipsia , polyphagia or weight change Other:  All other systems negative  Allergies:  Other and Quinapril hcl  Physical Examination:  VS: BP 132/92 mmHg  Pulse 95  Ht 5\' 8"  (1.727 m)  Wt 264 lb (119.75 kg)  BMI 40.15 kg/m2  SpO2 93%  General Appearance: No distress  HEENT: PERRLA, no ptosis, no other lesions noticed Pulmonary:normal breath sounds., diaphragmatic excursion normal.No wheezing, No rales   Cardiovascular:  Normal S1,S2.  No m/r/g.     Abdomen:Exam: Benign, Soft, non-tender, No masses  Skin:   warm, no rashes, no ecchymosis  Extremities: normal, no cyanosis, clubbing, warm with normal capillary refill.    Pulmonary function tests 11/27/2014 FEV1 91% FEV1/FVC 79% SVC 92%  RV 119% TLC 100% RV/TLC 118% ERV 9% DLCO 117%  Impression: Essentially normal PFTs with no obstructive process, however patient is on broncho-dilator therapy, severely reduced ERV; could be due to chest wall/abdominal obesity. 6 minute walk test: 312 m/1023 feet, lowest saturation 93%, highest heart rate 113.  Rad results: (The following images and results were reviewed by Dr. Stevenson Clinch). CXR 10/28/14 FINDINGS: Heart, mediastinum and hila are unremarkable. There is linear opacity at the left lung base consistent with subsegmental atelectasis and/or scarring. Lungs otherwise clear. No pleural effusion or pneumothorax. The bony thorax is intact.  IMPRESSION: No active  cardiopulmonary disease.    Assessment and Plan: 55 -year-old male seen in follow-up visit for dyspnea on exertion and clinical COPD.  COPD (chronic obstructive pulmonary disease) Patient with normal PFTs, had rapid improvement with bronchodilation therapy (Breo). In the setting of normal PFTs, but would clinical concerns for COPD. I believe the patient is still having COPD, with a nonobstructive process on pulmonary function testing. I reviewed the side effects of Breo in depth with the patient, I do not believe that Memory Dance was causing some his personality  and mood changes at this time. I've advised the patient to give a trial off Breo, and monitor his mood changes; if mood returns to normal then continue with [facility] therapy; if mood changes still occur, then likely this is not related to [facility], and he is advised to continue with Breo.  His chest x-ray was also reviewed in detail with him. There is some mild right lower lobe atelectasis, and scarring from previous infection, To the patient that in the event he is to have another significant pneumonia the area of scarring is a likely nidus for infection.   Plan: -Treat as mild to moderate COPD, continue with bronchodilation therapy, As stated above -We'll give a trial off Breo, if there are no changes, then advised to restart Breo. -Continue with tobacco avoidance. -Continue with CPAP compliance.    OSA on CPAP Patient with known history of obstructive sleep apnea,   Review of records showed that he had a split-night study done on 12/29/2004, patient had 98 obstructive apneas, 1 central apnea, 1 mixed apnea, 65 Apneas, all AHI 37.1 per hour diagnosed with severe sleep apnea. Repeat split-night study on 01/12/15 showed that CPAP titration was begun at 5, titrated up to 19, patient still continued to have apneic  events, recommendation at that time was 2 consider AutoPap. Patient is following with his primary care physician, Dr. Caryl Comes, for sleep  apnea.  Patient educated on continued use of CPAP machine, and being greater than 70% compliant, patient also educated on the benefits of using CPAP.    Obesity OBESITY  Discussed importance of weight reduction.  Educated regarding limitation of  intake of greasy/fried foods.  Instructed on benefit of  a low-impact exercise program, starting slowly.  Discussed benefits of 30-45 minutes of some form of exercise daily as well as benefit of supervised exercise program.         Updated Medication List Outpatient Encounter Prescriptions as of 11/27/2014  Medication Sig  . albuterol (PROVENTIL HFA;VENTOLIN HFA) 108 (90 BASE) MCG/ACT inhaler Inhale 2 puffs into the lungs as needed.  . ALPRAZolam (XANAX) 1 MG tablet Take 1 mg by mouth 3 (three) times daily as needed.  . Coenzyme Q10 (COQ-10) 100 MG capsule Take 100 mg by mouth daily. With red yeast rice.  Marland Kitchen EPINEPHrine 0.3 mg/0.3 mL IJ SOAJ injection Inject 0.3 mg into the muscle as needed.  . fluticasone (FLONASE) 50 MCG/ACT nasal spray Place 2 sprays into the nose daily.  . Fluticasone Furoate-Vilanterol (BREO ELLIPTA) 200-25 MCG/INH AEPB Inhale 200 mcg into the lungs daily.  . hydrochlorothiazide (HYDRODIURIL) 25 MG tablet Take 25 mg by mouth daily.  Marland Kitchen levothyroxine (SYNTHROID, LEVOTHROID) 112 MCG tablet Take 112 mcg by mouth daily.  . OMEGA-3 FATTY ACIDS PO Take 1 capsule by mouth daily.  . saw palmetto 160 MG capsule Take 1 capsule by mouth daily.  . valsartan (DIOVAN) 320 MG tablet Take 320 mg by mouth daily.  . vitamin E 1000 UNIT capsule Take 1 capsule by mouth daily.  . [DISCONTINUED] Fluticasone Furoate-Vilanterol (BREO ELLIPTA) 200-25 MCG/INH AEPB Inhale 200 mcg into the lungs daily.   No facility-administered encounter medications on file as of 11/27/2014.    Orders for this visit: No orders of the defined types were placed in this encounter.    Thank  you for the visitation and for allowing  [facility] to assist in the care of your patient. Our recommendations are noted above.  Please contact us if  we can be of further service.  Vilinda Boehringer, MD [facility] Office Number: [phone]

## 2014-11-28 ENCOUNTER — Telehealth: Payer: Self-pay | Admitting: *Deleted

## 2014-11-28 NOTE — Assessment & Plan Note (Signed)
OBESITY  Discussed importance of weight reduction.  Educated regarding limitation of  intake of greasy/fried foods.  Instructed on benefit of  a low-impact exercise program, starting slowly.  Discussed benefits of 30-45 minutes of some form of exercise daily as well as benefit of supervised exercise program.    

## 2014-11-28 NOTE — Telephone Encounter (Signed)
Patient was seen in office on yesterday. Patient checked out then came back to counter to ask if he could get handicap application completed. Anderson Malta called back to pod with the question. Message relayed to Dr. Stevenson Clinch who says NO he will not be able to complete this request at this time. Pt has been notified.

## 2014-11-28 NOTE — Assessment & Plan Note (Signed)
Patient with normal PFTs, had rapid improvement with bronchodilation therapy (Breo). In the setting of normal PFTs, but would clinical concerns for COPD. I believe the patient is still having COPD, with a nonobstructive process on pulmonary function testing. I reviewed the side effects of Breo in depth with the patient, I do not believe that Memory Dance was causing some his personality and mood changes at this time. I've advised the patient to give a trial off Breo, and monitor his mood changes; if mood returns to normal then continue with [facility] therapy; if mood changes still occur, then likely this is not related to [facility], and he is advised to continue with Breo.  His chest x-ray was also reviewed in detail with him. There is some mild right lower lobe atelectasis, and scarring from previous infection, To the patient that in the event he is to have another significant pneumonia the area of scarring is a likely nidus for infection.   Plan: -Treat as mild to moderate COPD, continue with bronchodilation therapy, As stated above -We'll give a trial off Breo, if there are no changes, then advised to restart Breo. -Continue with tobacco avoidance. -Continue with CPAP compliance.

## 2014-11-28 NOTE — Assessment & Plan Note (Signed)
Patient with known history of obstructive sleep apnea,   Review of records showed that he had a split-night study done on 12/29/2004, patient had 98 obstructive apneas, 1 central apnea, 1 mixed apnea, 65 Apneas, all AHI 37.1 per hour diagnosed with severe sleep apnea. Repeat split-night study on 01/12/15 showed that CPAP titration was begun at 5, titrated up to 19, patient still continued to have apneic  events, recommendation at that time was 2 consider AutoPap. Patient is following with his primary care physician, Dr. Caryl Comes, for sleep apnea.  Patient educated on continued use of CPAP machine, and being greater than 70% compliant, patient also educated on the benefits of using CPAP.

## 2015-01-05 ENCOUNTER — Other Ambulatory Visit: Payer: Self-pay | Admitting: Internal Medicine

## 2015-01-05 MED ORDER — FLUTICASONE FUROATE-VILANTEROL 200-25 MCG/INH IN AEPB: 1.0000 | INHALATION_SPRAY | Freq: Every day | RESPIRATORY_TRACT | Status: DC

## 2015-10-15 ENCOUNTER — Telehealth: Payer: Self-pay | Admitting: Internal Medicine

## 2015-10-15 NOTE — Telephone Encounter (Signed)
Pt calling stating last time he was here he was given a coupon for his brio inhaler Only used it about 8 times and it seems to have run out, was told to ask for another one by pharmacy Please advise or let him know when he may come by and get another one.

## 2015-10-15 NOTE — Telephone Encounter (Signed)
Spoke with pt and he will come by to pick up coupon card for [facility]. Nothing further needed.

## 2015-11-23 DIAGNOSIS — E034 Atrophy of thyroid (acquired): Secondary | ICD-10-CM | POA: Insufficient documentation

## 2015-11-23 DIAGNOSIS — M159 Polyosteoarthritis, unspecified: Secondary | ICD-10-CM | POA: Insufficient documentation

## 2015-11-23 DIAGNOSIS — I10 Essential (primary) hypertension: Secondary | ICD-10-CM | POA: Insufficient documentation

## 2015-11-23 DIAGNOSIS — M150 Primary generalized (osteo)arthritis: Secondary | ICD-10-CM

## 2016-10-03 DIAGNOSIS — G3184 Mild cognitive impairment, so stated: Secondary | ICD-10-CM | POA: Insufficient documentation

## 2016-10-03 DIAGNOSIS — F411 Generalized anxiety disorder: Secondary | ICD-10-CM | POA: Insufficient documentation

## 2016-10-03 DIAGNOSIS — F5104 Psychophysiologic insomnia: Secondary | ICD-10-CM | POA: Insufficient documentation

## 2016-10-03 DIAGNOSIS — E237 Disorder of pituitary gland, unspecified: Secondary | ICD-10-CM | POA: Insufficient documentation

## 2017-09-14 ENCOUNTER — Telehealth: Payer: Self-pay | Admitting: Pulmonary Disease

## 2017-09-14 ENCOUNTER — Encounter: Payer: Self-pay | Admitting: Pulmonary Disease

## 2017-09-14 NOTE — Telephone Encounter (Signed)
3 attempts to schedule fu appt from recall list (former [facility] patient).  Mailed letter.  Deleting recall.

## 2018-03-08 DIAGNOSIS — M2550 Pain in unspecified joint: Secondary | ICD-10-CM | POA: Insufficient documentation

## 2018-03-08 DIAGNOSIS — L409 Psoriasis, unspecified: Secondary | ICD-10-CM | POA: Insufficient documentation

## 2018-03-21 ENCOUNTER — Other Ambulatory Visit: Payer: Self-pay | Admitting: Orthopedic Surgery

## 2018-03-21 DIAGNOSIS — M9933 Osseous stenosis of neural canal of lumbar region: Secondary | ICD-10-CM

## 2018-03-21 DIAGNOSIS — M4807 Spinal stenosis, lumbosacral region: Secondary | ICD-10-CM

## 2018-04-01 ENCOUNTER — Ambulatory Visit
Admission: RE | Admit: 2018-04-01 | Discharge: 2018-04-01 | Disposition: A | Discharge status: Home / Self Care | Payer: No Typology Code available for payment source | Source: Ambulatory Visit | Attending: Orthopedic Surgery | Admitting: Orthopedic Surgery

## 2018-04-01 DIAGNOSIS — M9933 Osseous stenosis of neural canal of lumbar region: Secondary | ICD-10-CM

## 2018-04-01 DIAGNOSIS — M4807 Spinal stenosis, lumbosacral region: Secondary | ICD-10-CM

## 2018-04-16 ENCOUNTER — Ambulatory Visit: Payer: Self-pay | Admitting: Pulmonary Disease

## 2018-04-16 ENCOUNTER — Encounter: Payer: Self-pay | Admitting: Pulmonary Disease

## 2018-04-16 VITALS — BP 130/86 | HR 81 | Ht 66.5 in | Wt 248.8 lb

## 2018-04-16 DIAGNOSIS — J4540 Moderate persistent asthma, uncomplicated: Secondary | ICD-10-CM

## 2018-04-16 DIAGNOSIS — Z87891 Personal history of nicotine dependence: Secondary | ICD-10-CM

## 2018-04-16 MED ORDER — BUDESONIDE-FORMOTEROL FUMARATE 160-4.5 MCG/ACT IN AERO: 2.0000 | INHALATION_SPRAY | Freq: Two times a day (BID) | RESPIRATORY_TRACT | 6 refills | Status: AC

## 2018-04-16 NOTE — Progress Notes (Signed)
PULMONARY OFFICE FOLLOW UP NOTE  Requesting MD/Service: Self Date of initial consultation: 04/16/18 Reason for consultation:   PT PROFILE: 62 y.o. male former smoker (15 PY history, quit 1980 except brief relapse in 1998) previously seen by Dr Stevenson Clinch and diagnosed with "COPD" (though no obstruction noted on PFTs). Returned to clinic because he felt that he needed to be rechecked.  He also has a history of OSA  DATA: 09/25/14 PFTs: FVC: 3.91 L (87 %pred), FEV1: 3.10 L (91 %pred), FEV1/FVC: 79%, TLC: 6.65 L (100 %pred), DLCO 117 %pred    INTERVAL: Last seen by Dr. Stevenson Clinch in 2016.  Has remained on ICS/LABA since that time.  He uses various formulations depending on out-of-pocket expense  Subjective: This is a scheduled follow-up to get reacquainted with pulmonary service.  He was previously diagnosed with COPD.  However I note that his PFTs failed to demonstrate significant obstruction.  He has a mild, remote smoking history.  He did previously seem to benefit from initiation of ICS/LABA.  Presently he is on Symbicort which he obtains from [place].  He is unaware of the strength Symbicort.  He uses albuterol rescue inhaler 4-6 times per month.  Overall, he feels that his respiratory symptoms are well controlled.  He has no new complaints.  He does have chronic back pain with herniated disks and is undergoing evaluation for that in the near future.  He thinks that he might require surgery.    Vitals:   04/16/18 1356 04/16/18 1402  BP:  130/86  Pulse:  81  SpO2:  97%  Weight: 248 lb 12.8 oz (112.9 kg)   Height: 5' 6.5" (1.689 m)   RA   EXAM:  Gen: Moderately obese, No overt respiratory distress HEENT: NCAT, sclera white, oropharynx normal Neck: Supple without LAN, thyromegaly, JVD Lungs: breath sounds full, percussion normal, adventitious sounds: None Cardiovascular: RRR, no murmurs noted Abdomen: Soft, nontender, normal BS Ext: without clubbing, cyanosis, edema Neuro: CNs grossly  intact, motor and sensory intact Skin: Limited exam, no lesions noted  DATA:   BMP Latest Ref Rng & Units 01/02/2013 12/31/2012 12/30/2012  Glucose 65 - 99 mg/dL 82 77 99  BUN 7 - 18 mg/dL 9 13 14   Creatinine 0.60 - 1.30 mg/dL 0.92 0.62 1.22  Sodium 136 - 145 mmol/L 136 139 139  Potassium 3.5 - 5.1 mmol/L 4.1 4.0 3.5  Chloride 98 - 107 mmol/L 104 107 105  CO2 21 - 32 mmol/L 24 24 31   Calcium 8.5 - 10.1 mg/dL 8.7 7.9(L) 8.4(L)    CBC Latest Ref Rng & Units 01/04/2013 01/03/2013 01/02/2013  WBC 3.8 - 10.6 x10 3/mm 3 - 13.9(H) 18.5(H)  Hemoglobin 13.0 - 18.0 g/dL 13.6 - 14.5  Hematocrit 40.0 - 52.0 % 39.6(L) - 42.8  Platelets 150 - 440 x10 3/mm 3 353 - 386    CXR: No recent film  I have personally reviewed all chest radiographs reported above including CXRs and CT chest unless otherwise indicated  IMPRESSION:     ICD-10-CM   1. Moderate persistent asthma without complication Q25.95   2. Former smoker Z87.891    He cannot really be labeled as "COPD" if obstruction is not demonstrated on PFTs.  Therefore, I think it is history and response to inhaler medications more consistent with moderate persistent asthma  PLAN:  Continue Symbicort inhaler or equivalent Continue albuterol inhaler as needed Follow-up in 6 months or sooner if needed If back surgery as planned, I have asked him to  contact us and we will consider PFTs as a part of preoperative evaluation.   Merton Border, MD PCCM service Mobile [phone] Pager [phone] 04/16/2018 3:08 PM

## 2018-04-16 NOTE — Patient Instructions (Signed)
-  Continue Symbicort inhaler or equivalent.  Rinse mouth after use -Continue albuterol inhaler as needed for increased wheezing, cough, chest tightness, shortness of breath -Follow-up in 6 months or sooner as needed -If any surgery is planned on your back, contact us to schedule pulmonary function tests prior to the procedure

## 2018-07-17 ENCOUNTER — Telehealth: Payer: Self-pay

## 2018-07-17 NOTE — Telephone Encounter (Signed)
Disability paperwork received via mail from [employer] [employer]. Faxed to [employer].  

## 2018-07-26 ENCOUNTER — Encounter: Payer: Self-pay | Admitting: Psychiatry

## 2018-07-26 ENCOUNTER — Ambulatory Visit: Payer: Self-pay | Admitting: Psychiatry

## 2018-07-26 ENCOUNTER — Other Ambulatory Visit: Payer: Self-pay

## 2018-07-26 VITALS — BP 154/92 | HR 78 | Temp 97.4°F | Wt 223.6 lb

## 2018-07-26 DIAGNOSIS — F411 Generalized anxiety disorder: Secondary | ICD-10-CM

## 2018-07-26 DIAGNOSIS — T7840XA Allergy, unspecified, initial encounter: Secondary | ICD-10-CM | POA: Insufficient documentation

## 2018-07-26 DIAGNOSIS — E785 Hyperlipidemia, unspecified: Secondary | ICD-10-CM | POA: Insufficient documentation

## 2018-07-26 DIAGNOSIS — F339 Major depressive disorder, recurrent, unspecified: Secondary | ICD-10-CM

## 2018-07-26 DIAGNOSIS — F5105 Insomnia due to other mental disorder: Secondary | ICD-10-CM

## 2018-07-26 DIAGNOSIS — F32A Depression, unspecified: Secondary | ICD-10-CM | POA: Insufficient documentation

## 2018-07-26 DIAGNOSIS — F329 Major depressive disorder, single episode, unspecified: Secondary | ICD-10-CM | POA: Insufficient documentation

## 2018-07-26 DIAGNOSIS — G3184 Mild cognitive impairment, so stated: Secondary | ICD-10-CM

## 2018-07-26 MED ORDER — VORTIOXETINE HBR 5 MG PO TABS: 5.0000 mg | ORAL_TABLET | Freq: Every day | ORAL | 1 refills | Status: DC

## 2018-07-26 NOTE — Patient Instructions (Signed)
Vortioxetine oral tablet  What is this medicine?  Vortioxetine (vor tee OX e teen) is used to treat depression.  This medicine may be used for other purposes; ask your health care provider or pharmacist if you have questions.  COMMON BRAND NAME(S): BRINTELLIX, Trintellix  What should I tell my health care provider before I take this medicine?  They need to know if you have any of these conditions:  -bipolar disorder or a family history of bipolar disorder  -bleeding disorders  -drink alcohol  -glaucoma  -liver disease  -low levels of sodium in the blood  -seizures  -suicidal thoughts, plans, or attempt; a previous suicide attempt by you or a family member  -take medicines that treat or prevent blood clots  -an unusual or allergic reaction to vortioxetine, other medicines, foods, dyes, or preservatives  -pregnant or trying to get pregnant  -breast-feeding  How should I use this medicine?  Take this medicine by mouth with a glass of water. Follow the directions on the prescription label. You can take it with or without food. If it upsets your stomach, take it with food. Take your medicine at regular intervals. Do not take it more often than directed. Do not stop taking this medicine suddenly except upon the advice of your doctor. Stopping this medicine too quickly may cause serious side effects or your condition may worsen.  A special MedGuide will be given to you by the pharmacist with each prescription and refill. Be sure to read this information carefully each time.  Talk to your pediatrician regarding the use of this medicine in children. Special care may be needed.  Overdosage: If you think you have taken too much of this medicine contact a poison control center or emergency room at once.  NOTE: This medicine is only for you. Do not share this medicine with others.  What if I miss a dose?  If you miss a dose, take it as soon as you can. If it is almost time for your next dose, take only that dose. Do not take  double or extra doses.  What may interact with this medicine?  Do not take this medicine with any of the following medications:  -linezolid  -MAOIs like Carbex, Eldepryl, Marplan, Nardil, and Parnate  -methylene blue (injected into a vein)  This medicine may also interact with the following medications:  -alcohol  -aspirin and aspirin-like medicines  -carbamazepine  -certain medicines for depression, anxiety, or psychotic disturbances  -certain medicines for migraine headache like almotriptan, eletriptan, frovatriptan, naratriptan, rizatriptan, sumatriptan, zolmitriptan  -diuretics  -fentanyl  -furazolidone  -isoniazid  -medicines that treat or prevent blood clots like warfarin, enoxaparin, and dalteparin  -NSAIDs, medicines for pain and inflammation, like ibuprofen or naproxen  -phenytoin  -procarbazine  -quinidine  -rasagiline  -rifampin  -supplements like St. John's wort, kava kava, valerian  -tramadol  -tryptophan  This list may not describe all possible interactions. Give your health care provider a list of all the medicines, herbs, non-prescription drugs, or dietary supplements you use. Also tell them if you smoke, drink alcohol, or use illegal drugs. Some items may interact with your medicine.  What should I watch for while using this medicine?  Tell your doctor if your symptoms do not get better or if they get worse. Visit your doctor or health care professional for regular checks on your progress. Because it may take several weeks to see the full effects of this medicine, it is important to continue your treatment   as prescribed by your doctor.  Patients and their families should watch out for new or worsening thoughts of suicide or depression. Also watch out for sudden changes in feelings such as feeling anxious, agitated, panicky, irritable, hostile, aggressive, impulsive, severely restless, overly excited and hyperactive, or not being able to sleep. If this happens, especially at the beginning of  treatment or after a change in dose, call your health care professional.  You may get drowsy or dizzy. Do not drive, use machinery, or do anything that needs mental alertness until you know how this medicine affects you. Do not stand or sit up quickly, especially if you are an older patient. This reduces the risk of dizzy or fainting spells. Alcohol may interfere with the effect of this medicine. Avoid alcoholic drinks.  Your mouth may get dry. Chewing sugarless gum or sucking hard candy, and drinking plenty of water may help. Contact your doctor if the problem does not go away or is severe.  What side effects may I notice from receiving this medicine?  Side effects that you should report to your doctor or health care professional as soon as possible:  -allergic reactions like skin rash, itching or hives, swelling of the face, lips, or tongue  -anxious  -black, tarry stools  -changes in vision  -confusion  -elevated mood, decreased need for sleep, racing thoughts, impulsive behavior  -eye pain  -fast, irregular heartbeat  -feeling faint or lightheaded, falls  -feeling agitated, angry, or irritable  -hallucination, loss of contact with reality  -loss of balance or coordination  -loss of memory  -painful or prolonged erections  -restlessness, pacing, inability to keep still  -seizures  -stiff muscles  -suicidal thoughts or other mood changes  -trouble sleeping  -unusual bleeding or bruising  -unusually weak or tired  -vomiting  Side effects that usually do not require medical attention (report to your doctor or health care professional if they continue or are bothersome):  -change in appetite or weight  -change in sex drive or performance  -constipation  -dizziness  -dry mouth  -nausea  This list may not describe all possible side effects. Call your doctor for medical advice about side effects. You may report side effects to FDA at 1-800-FDA-1088.  Where should I keep my medicine?  Keep out of the reach of  children.  Store at room temperature between 15 and 30 degrees C (59 and 86 degrees F). Throw away any unused medicine after the expiration date.  NOTE: This sheet is a summary. It may not cover all possible information. If you have questions about this medicine, talk to your doctor, pharmacist, or health care provider.   2019 Elsevier/Gold Standard (2015-10-29 16:45:13)

## 2018-07-26 NOTE — Progress Notes (Signed)
Psychiatric Initial Adult Assessment   Patient Identification: Frank Golden MRN:  [id number] Date of Evaluation:  07/26/2018 Referral Source: Ramonita Lab MD Chief Complaint:  ' I am here to establish care." Chief Complaint    Establish Care; Anxiety; Depression; Stress     Visit Diagnosis:    ICD-10-CM   1. GAD (generalized anxiety disorder) F41.1 vortioxetine HBr (TRINTELLIX) 5 MG TABS tablet  2. Major depressive disorder, recurrent episode with anxious distress ([place]) F33.9   3. Insomnia due to mental disorder F51.05   4. MCI (mild cognitive impairment) with memory loss G31.84     History of Present Illness:  Frank Golden is a 63 year old Caucasian male, unemployed, applied for disability, lives in [place], married, has a history of anxiety, depression, memory problems, hypothyroidism, spinal stenosis, arthritis, chronic pain presented to clinic today to establish care.  Patient reports he has been struggling with anxiety and depression since the past several years.  He reports his anxiety symptoms started getting worse and it started affecting his memory.  He reports he was evaluated by neurology-Dr. Manuella Ghazi.  He reports he was referred for neuropsychological testing.  Patient reports he was told per testing that he does not have dementia and his memory problems likely are due to his depression and anxiety.  Patient hence was referred to psychiatry.  Patient today reports he has been struggling with anxiety since the past several months.  He reports anxiety symptoms as being restless, feeling anxious, trouble relaxing, becoming easily annoyed or irritable, feeling afraid something awful might happen, being on edge and so  on a regular basis.  This has been worsening since the past few weeks.  He reports he has tried several medications including SSRIs however he has not noticed any benefit from medications itself.  He reports he currently uses CBD which may be helping to some extent.  He also is on  clonazepam 1 mg at bedtime which he thinks may be helpful.  Patient reports depressive symptoms like sadness, anhedonia, lack of motivation, psychomotor agitation, inability to concentrate and sewn on a regular basis.  This has been worsening since the past several months.  He denies any suicidality or homicidality.  He denies any perceptual disturbances.  Patient reports he has sleep problems.  He reports he sleeps only for 2 to 3 hours at night.  He uses CPAP for OSA.  He reports the clonazepam may also have been started for his sleep and may be helpful to some extent.  Patient reports several psychosocial stressors.  He reports he is worried about his financial problems.  He also reports he is unable to find a work that fits his physical needs.  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  He reports however he will not be able to work in that field anymore due to his multiple medical problems including chronic pain and so on.  Patient had MMSE screening done at today's visit.  I have also reviewed medical records in [facility] per Dr. Manuella Ghazi as well as Dr. Caryl Comes.  Based on medical records per Dr. 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  Patient with GAD-taking clonazepam 1 mg at night-sending referral for psychiatric evaluation.'  I have reviewed medical records in [facility] per Dr. Caryl Comes dated 06/04/2018-patient seen for hypertension, hyperlipidemia, osteoarthritis, COPD, pituitary microadenoma.  Patient was continued on his medications.  His microadenoma does not  appear to be active.  Further brain imaging deferred to neurology unless he develops vision changes or other symptoms.'      Associated Signs/Symptoms: Depression Symptoms:  depressed mood, insomnia, psychomotor agitation, fatigue, difficulty concentrating, hopelessness, impaired  memory, anxiety, (Hypo) Manic Symptoms:  denies Anxiety Symptoms:  Excessive Worry, Psychotic Symptoms:  denies PTSD Symptoms: Negative  Past Psychiatric History: Patient has been treated by psychiatrist in the past.  He does not remember the names.  Patient denies suicide attempts.  Patient denies inpatient mental health admissions.  Patient reports multiple trials of medications in the past.  Previous Psychotropic Medications: Yes -Patient reports he has been tried on at least 4 SSRIs and they did not help.  He does remember Celexa-he does not remember the names of other medications.klonopin.  Substance Abuse History in the last 12 months:  No.  Consequences of Substance Abuse: Negative  Past Medical History:  Past Medical History:  Diagnosis Date  . Allergic state   . Cancer ([place])    skin cancer on face  . Depression   . DJD (degenerative joint disease)   . ED (erectile dysfunction)   . History of chicken pox   . History of pancreatitis   . Hyperlipidemia   . Hypertension   . Hypertension   . Hypothyroid   . OCD (obsessive compulsive disorder)   . Psoriasis   . Rheumatoid arthritis ([place])   . Sleep apnea    uses CPAP    Past Surgical History:  Procedure Laterality Date  . APPENDECTOMY    . CYSTECTOMY    . endoscopic carpal tunnell    . LAMINECTOMY    . MOHS SURGERY     basal cell on nose    Family Psychiatric History:Mother-Depression and anxiety, maternal grandmother-depression and anxiety  Family History:  Family History  Problem Relation Age of Onset  . COPD Mother   . Arthritis Mother   . Osteoporosis Mother   . Anxiety disorder Mother   . Depression Mother   . Stroke Father   . Cancer Father        penile  . Heart failure Father   . Aneurysm Father   . Hypertension Father   . Coronary artery disease Father   . Anxiety disorder Maternal Grandmother   . Depression Maternal Grandmother     Social History:   Social History   Socioeconomic  History  . Marital status: Married    Spouse name: marian  . Number of children: 1  . Years of education: Not on file  . Highest education level: Associate degree: occupational, Hotel manager, or vocational program  Occupational History  . Not on file  Social Needs  . Financial resource strain: Hard  . Food insecurity:    Worry: Often true    Inability: Often true  . Transportation needs:    Medical: No    Non-medical: No  Tobacco Use  . Smoking status: Former Smoker    Packs/day: 1.00    Years: 15.00    Pack years: 15.00    Types: Cigarettes    Last attempt to quit: 1980    Years since quitting: 40.1  . Smokeless tobacco: Never Used  Substance and Sexual Activity  . Alcohol use: Yes    Alcohol/week: 6.0 standard drinks    Types: 6 Shots of liquor per week  . Drug use: No  . Sexual activity: Yes  Lifestyle  . Physical activity:    Days per week: 0 days  Minutes per session: 0 min  . Stress: Very much  Relationships  . Social connections:    Talks on phone: Not on file    Gets together: Not on file    Attends religious service: Never    Active member of club or organization: Yes    Attends meetings of clubs or organizations: More than 4 times per year    Relationship status: Married  Other Topics Concern  . Not on file  Social History Narrative  . Not on file    Additional Social History: Pt has been married twice, divorced once.  Patient used to work in in Therapist, music previously.  Patient has 2 adult daughters, grandchildren and great-grandchildren.  Patient has applied for disability-pending.  Allergies:   Allergies  Allergen Reactions  . Other     Other reaction(s): Unknown Uncoded Allergy. Allergen: bee stings  . Quinapril Hcl     Other reaction(s): Cough    Metabolic Disorder Labs: Lab Results  Component Value Date   HGBA1C 6.1 12/25/2012   No results found for: PROLACTIN Lab Results  Component Value Date   CHOL 148 12/25/2012   TRIG 127  12/25/2012   HDL 62 (H) 12/25/2012   VLDL 25 12/25/2012   LDLCALC 61 12/25/2012   Lab Results  Component Value Date   TSH 0.740 12/25/2012    Therapeutic Level Labs: No results found for: LITHIUM No results found for: CBMZ No results found for: VALPROATE  Current Medications: Current Outpatient Medications  Medication Sig Dispense Refill  . ALBUTEROL SULFATE IN Inhale into the lungs.    . B Complex Vitamins (B-COMPLEX/B-12) TABS Take by mouth.    . budesonide-formoterol (SYMBICORT) 160-4.5 MCG/ACT inhaler Inhale 2 puffs into the lungs 2 (two) times daily. 1 Inhaler 6  . Capsicum-Garlic (CAYENNE PLUS GARLIC PO) Take by mouth.    . clonazePAM (KLONOPIN) 1 MG tablet Take 1 mg by mouth at bedtime.    . Coenzyme Q-10 100 MG capsule Take by mouth.    . Coenzyme Q10 (COQ-10) 100 MG capsule Take 100 mg by mouth daily. With red yeast rice.    . donepezil (ARICEPT) 5 MG tablet Take by mouth.    . EPINEPHrine 0.3 mg/0.3 mL IJ SOAJ injection Inject 0.3 mg into the muscle as needed.    . fluticasone (FLONASE) 50 MCG/ACT nasal spray Place 2 sprays into the nose daily.    Marland Kitchen gabapentin (NEURONTIN) 300 MG capsule TAKE ONE CAPSULE BY MOUTH THREE TIMES A DAY    . Garlic 8099 MG CAPS Take by mouth.    . hydrochlorothiazide (HYDRODIURIL) 25 MG tablet Take 25 mg by mouth daily.    Marland Kitchen levothyroxine (SYNTHROID, LEVOTHROID) 112 MCG tablet Take 112 mcg by mouth daily.    . Methylsulfonylmethane 1500 MG TABS Take by mouth.    . Multiple Vitamins-Minerals (MULTIVITAMIN ADULT PO) Take by mouth.    . OMEGA-3 FATTY ACIDS PO Take 1 capsule by mouth daily.    Vladimir Faster Glycol-Propyl Glycol 0.4-0.3 % SOLN Apply to eye.    . saw palmetto 160 MG capsule Take 1 capsule by mouth daily.    . traMADol (ULTRAM) 50 MG tablet Take by mouth.    . Turmeric, Curcuma Longa, (TURMERIC ROOT) POWD Take by mouth.    . valsartan (DIOVAN) 320 MG tablet Take 320 mg by mouth daily.    Marland Kitchen VITAMIN C, CALCIUM ASCORBATE, PO Take by  mouth.    . vitamin E 1000 UNIT capsule Take  1 capsule by mouth daily.    . vitamin E 400 UNIT capsule Take by mouth.    Marland Kitchen ZINC GLUCONATE PO Take by mouth.    Marland Kitchen albuterol (PROVENTIL HFA;VENTOLIN HFA) 108 (90 BASE) MCG/ACT inhaler Inhale 2 puffs into the lungs as needed.    . vortioxetine HBr (TRINTELLIX) 5 MG TABS tablet Take 1 tablet (5 mg total) by mouth daily. 30 tablet 1   No current facility-administered medications for this visit.     Musculoskeletal: Strength & Muscle Tone: within normal limits Gait & Station: normal Patient leans: N/A  Psychiatric Specialty Exam: Review of Systems  Psychiatric/Behavioral: Positive for depression. The patient is nervous/anxious and has insomnia.   All other systems reviewed and are negative.   Blood pressure (!) 154/92, pulse 78, temperature (!) 97.4 F (36.3 C), temperature source Oral, weight 223 lb 9.6 oz (101.4 kg).Body mass index is 35.55 kg/m.  General Appearance: Casual  Eye Contact:  Fair  Speech:  Clear and Coherent  Volume:  Normal  Mood:  Anxious and Dysphoric  Affect:  Congruent  Thought Process:  Goal Directed and Descriptions of Associations: Intact  Orientation:  Full (Time, Place, and Person)  Thought Content:  Logical  Suicidal Thoughts:  No  Homicidal Thoughts:  No  Memory:  Immediate;   Fair Recent;   Fair Remote;   limited  Judgement:  Fair  Insight:  Fair  Psychomotor Activity:  Normal  Concentration:  Concentration: Fair and Attention Span: Fair  Recall:  [employer] of Knowledge:Fair  Language: Fair  Akathisia:  No  Handed:  Right  AIMS (if indicated):denies tremors, rigidity,stiffness  Assets:  Communication Skills Desire for Improvement Social Support  ADL's:  Intact  Cognition: WNL  Sleep:  Poor   Screenings:   Assessment and Plan: Aryn is a 63 yr old Caucasian male, married, unemployed, applied for disability-pending, presented to clinic today to establish care.  Patient with history of  depression, anxiety as well as multiple medical problems including hypothyroidism due to atrophy of thyroid, pituitary lesion, mild cognitive impairment, primary osteoporotic arthritis, OSA on CPAP, lumbar stenosis.  Patient is biologically predisposed given his multiple medical problems as well as family history.  Patient also has psychosocial stressors of multiple medical issues as well as financial problems, being unemployed.  Patient will benefit from medications as well as psychotherapy sessions.  Plan Generalized anxiety disorder GAD 7 equals 17 Refer for CBT.  For MDD with anxious distress PHQ 9 equals 11 Start Trintellix 5 mg p.o. daily Refer for CBT  For insomnia Continue Klonopin at this time. Discussed with him the risk of being on Klonopin including its effect on his memory. Will address this during future sessions.  For mild cognitive impairment He will continue to follow-up with neurology MMSE completed today-30 out of 30.  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  Will monitor closely. He will continue Aricept at this time per neurology.  I have reviewed TSH in [facility]-dated 05/22/2018-4.457-within normal limits.  I have reviewed medical records-Per Dr. Bernerd Limbo 10/24/ 2019-as summarized above.  I have reviewed medical records per Dr. Lorenz Coaster 06/04/2018-as summarized above.  Will provide patient information for medication management clinic today.-Provided him with application.  Follow-up in clinic in 3 weeks or sooner if needed.  I have spent atleast 60 minutes face to face with patient today. More than 50 % of the time was spent for psychoeducation and supportive psychotherapy and care coordination.  This note was generated in part or whole with voice recognition software. Voice recognition is usually quite accurate but there are transcription errors that can and very often do occur. I apologize for any typographical errors that  were not detected and corrected.        Ursula Alert, MD 2/13/20201:04 PM

## 2018-08-16 ENCOUNTER — Other Ambulatory Visit: Payer: Self-pay

## 2018-08-16 ENCOUNTER — Ambulatory Visit: Payer: Self-pay | Admitting: Psychiatry

## 2018-08-16 ENCOUNTER — Encounter: Payer: Self-pay | Admitting: Psychiatry

## 2018-08-16 VITALS — BP 144/87 | HR 78 | Temp 97.9°F | Wt 233.0 lb

## 2018-08-16 DIAGNOSIS — F411 Generalized anxiety disorder: Secondary | ICD-10-CM

## 2018-08-16 DIAGNOSIS — G3184 Mild cognitive impairment, so stated: Secondary | ICD-10-CM

## 2018-08-16 DIAGNOSIS — F5105 Insomnia due to other mental disorder: Secondary | ICD-10-CM

## 2018-08-16 DIAGNOSIS — F339 Major depressive disorder, recurrent, unspecified: Secondary | ICD-10-CM

## 2018-08-16 MED ORDER — MIRTAZAPINE 15 MG PO TABS: 7.5000 mg | ORAL_TABLET | Freq: Every day | ORAL | 0 refills | Status: DC

## 2018-08-16 NOTE — Progress Notes (Signed)
[facility] MD  OP Progress Note  08/16/2018 5:27 PM Frank Golden  MRN:  [id number]  Chief Complaint: ' I am here for follow up.' Chief Complaint    Follow-up     HPI: Frank Golden is a 63 year old Caucasian male, unemployed, applied for disability, lives in [place], married, has a history of anxiety, depression, memory problems, hypothyroidism, spinal stenosis, arthritis, chronic pain, presented to clinic today for a follow-up visit.  Patient today reports he continues to struggle with anxiety and depressive symptoms.  He also continues to have memory problems.  He struggles with short-term memory loss.  He however reports he has not started taking the Trintellix yet.  He reports he was told by the pharmacist that it is very expensive for him and his health insurance plan will not cover it.  He hence did not take the samples also.  He reports he stopped taking the Aricept and the Klonopin.  He did not call the clinic back to talk about other medication changes earlier since he wanted to get the other medications like Klonopin and Aricept completely out of his system.  He reports that he is right now ready to start a medication to help with his mood symptoms.  He also struggles with sleep.   He reports he has tried multiple SSRIs previously and does not do well on them.  Discussed mirtazapine and he agrees with plan.  Patient will start psychotherapy session with Ms. Peacock soon.  Patient advised to go to medication management clinic with completed forms for  prescription assistance. Visit Diagnosis:    ICD-10-CM   1. GAD (generalized anxiety disorder) F41.1 mirtazapine (REMERON) 15 MG tablet  2. Major depressive disorder, recurrent episode with anxious distress ([facility]) F33.9 mirtazapine (REMERON) 15 MG tablet  3. Insomnia due to mental disorder F51.05 mirtazapine (REMERON) 15 MG tablet  4. MCI (mild cognitive impairment) with memory loss G31.84     Past Psychiatric History: I have reviewed past  psychiatric history from my progress note on 07/26/2018.  Past trials of Celexa, Klonopin.  Also has tried at least 4 SSRIs in the past and does not remember the names.  Past Medical History:  Past Medical History:  Diagnosis Date  . Allergic state   . Cancer ([place])    skin cancer on face  . Depression   . DJD (degenerative joint disease)   . ED (erectile dysfunction)   . History of chicken pox   . History of pancreatitis   . Hyperlipidemia   . Hypertension   . Hypertension   . Hypothyroid   . OCD (obsessive compulsive disorder)   . Psoriasis   . Rheumatoid arthritis ([place])   . Sleep apnea    uses CPAP    Past Surgical History:  Procedure Laterality Date  . APPENDECTOMY    . CYSTECTOMY    . endoscopic carpal tunnell    . LAMINECTOMY    . MOHS SURGERY     basal cell on nose    Family Psychiatric History: I have reviewed family psychiatric history from my progress note on 07/26/2018  Family History:  Family History  Problem Relation Age of Onset  . COPD Mother   . Arthritis Mother   . Osteoporosis Mother   . Anxiety disorder Mother   . Depression Mother   . Stroke Father   . Cancer Father        penile  . Heart failure Father   . Aneurysm Father   . Hypertension Father   .  Coronary artery disease Father   . Anxiety disorder Maternal Grandmother   . Depression Maternal Grandmother     Social History: I have reviewed social history from my progress note on 07/26/2018 Social History   Socioeconomic History  . Marital status: Married    Spouse name: marian  . Number of children: 1  . Years of education: Not on file  . Highest education level: Associate degree: occupational, Hotel manager, or vocational program  Occupational History  . Not on file  Social Needs  . Financial resource strain: Hard  . Food insecurity:    Worry: Often true    Inability: Often true  . Transportation needs:    Medical: No    Non-medical: No  Tobacco Use  . Smoking status: Former  Smoker    Packs/day: 1.00    Years: 15.00    Pack years: 15.00    Types: Cigarettes    Last attempt to quit: 1980    Years since quitting: 40.2  . Smokeless tobacco: Never Used  Substance and Sexual Activity  . Alcohol use: Yes    Alcohol/week: 6.0 standard drinks    Types: 6 Shots of liquor per week  . Drug use: No  . Sexual activity: Yes  Lifestyle  . Physical activity:    Days per week: 0 days    Minutes per session: 0 min  . Stress: Very much  Relationships  . Social connections:    Talks on phone: Not on file    Gets together: Not on file    Attends religious service: Never    Active member of club or organization: Yes    Attends meetings of clubs or organizations: More than 4 times per year    Relationship status: Married  Other Topics Concern  . Not on file  Social History Narrative  . Not on file    Allergies:  Allergies  Allergen Reactions  . Other     Other reaction(s): Unknown Uncoded Allergy. Allergen: bee stings  . Quinapril Hcl     Other reaction(s): Cough    Metabolic Disorder Labs: Lab Results  Component Value Date   HGBA1C 6.1 12/25/2012   No results found for: PROLACTIN Lab Results  Component Value Date   CHOL 148 12/25/2012   TRIG 127 12/25/2012   HDL 62 (H) 12/25/2012   VLDL 25 12/25/2012   LDLCALC 61 12/25/2012   Lab Results  Component Value Date   TSH 0.740 12/25/2012    Therapeutic Level Labs: No results found for: LITHIUM No results found for: VALPROATE No components found for:  CBMZ  Current Medications: Current Outpatient Medications  Medication Sig Dispense Refill  . ALBUTEROL SULFATE IN Inhale into the lungs.    . B Complex Vitamins (B-COMPLEX/B-12) TABS Take by mouth.    . budesonide-formoterol (SYMBICORT) 160-4.5 MCG/ACT inhaler Inhale 2 puffs into the lungs 2 (two) times daily. 1 Inhaler 6  . Capsicum-Garlic (CAYENNE PLUS GARLIC PO) Take by mouth.    . Coenzyme Q-10 100 MG capsule Take by mouth.    . Coenzyme  Q10 (COQ-10) 100 MG capsule Take 100 mg by mouth daily. With red yeast rice.    Marland Kitchen EPINEPHrine 0.3 mg/0.3 mL IJ SOAJ injection Inject 0.3 mg into the muscle as needed.    . fluticasone (FLONASE) 50 MCG/ACT nasal spray Place 2 sprays into the nose daily.    Marland Kitchen gabapentin (NEURONTIN) 300 MG capsule TAKE ONE CAPSULE BY MOUTH THREE TIMES A DAY    .  Garlic 0300 MG CAPS Take by mouth.    . hydrochlorothiazide (HYDRODIURIL) 25 MG tablet Take 25 mg by mouth daily.    Marland Kitchen levothyroxine (SYNTHROID, LEVOTHROID) 112 MCG tablet Take 112 mcg by mouth daily.    . Methylsulfonylmethane 1500 MG TABS Take by mouth.    . Multiple Vitamins-Minerals (MULTIVITAMIN ADULT PO) Take by mouth.    . OMEGA-3 FATTY ACIDS PO Take 1 capsule by mouth daily.    Vladimir Faster Glycol-Propyl Glycol 0.4-0.3 % SOLN Apply to eye.    . saw palmetto 160 MG capsule Take 1 capsule by mouth daily.    . traMADol (ULTRAM) 50 MG tablet Take by mouth.    . Turmeric, Curcuma Longa, (TURMERIC ROOT) POWD Take by mouth.    . valsartan (DIOVAN) 320 MG tablet Take 320 mg by mouth daily.    Marland Kitchen VITAMIN C, CALCIUM ASCORBATE, PO Take by mouth.    . vitamin E 1000 UNIT capsule Take 1 capsule by mouth daily.    . vitamin E 400 UNIT capsule Take by mouth.    Marland Kitchen ZINC GLUCONATE PO Take by mouth.    Marland Kitchen albuterol (PROVENTIL HFA;VENTOLIN HFA) 108 (90 BASE) MCG/ACT inhaler Inhale 2 puffs into the lungs as needed.    . mirtazapine (REMERON) 15 MG tablet Take 0.5 tablets (7.5 mg total) by mouth at bedtime. For sleep and mood 15 tablet 0   No current facility-administered medications for this visit.      Musculoskeletal: Strength & Muscle Tone: within normal limits Gait & Station: normal Patient leans: N/A  Psychiatric Specialty Exam: Review of Systems  Psychiatric/Behavioral: Positive for depression and memory loss. The patient is nervous/anxious and has insomnia.   All other systems reviewed and are negative.   Blood pressure (!) 144/87, pulse 78,  temperature 97.9 F (36.6 C), temperature source Oral, weight 233 lb (105.7 kg).Body mass index is 37.04 kg/m.  General Appearance: Casual  Eye Contact:  Fair  Speech:  Clear and Coherent  Volume:  Normal  Mood:  Anxious and Dysphoric  Affect:  Appropriate  Thought Process:  Goal Directed and Descriptions of Associations: Intact  Orientation:  Full (Time, Place, and Person)  Thought Content: Logical   Suicidal Thoughts:  No  Homicidal Thoughts:  No  Memory:  Immediate;   Fair Recent;   Fair Remote;   Fair  Judgement:  Fair  Insight:  Good  Psychomotor Activity:  Normal  Concentration:  Concentration: Fair and Attention Span: Fair  Recall:  [employer] of Knowledge: Fair  Language: Fair  Akathisia:  No  Handed:  Right  AIMS (if indicated): Denies tremors, rigidity,stiffness  Assets:  Communication Skills Desire for Improvement Social Support  ADL's:  Intact  Cognition: WNL  Sleep:  Poor   Screenings:   Assessment and Plan: Frank Golden is a 63 yr old Caucasian male, married, unemployed, applied for disability-pending, presented to clinic today for a follow-up visit.  Patient with history of depression, anxiety, cognitive impairment, hypothyroidism, pituitary lesion, OSA on CPAP, who still parotic arthritis, lumbar stenosis, presented to clinic today for a follow-up visit.  Patient is biologically predisposed given his multiple medical problems as well as mental health problems in his family.  Patient also has psychosocial stressors of medical problems and financial issues.  Patient will continue to benefit from medication changes as well as psychotherapy sessions.  Plan GAD- unstable Discontinue Trintellix due to cost Start mirtazapine 7.5 mg p.o. nightly Continue CBT  MDD-unstable Mirtazapine 7.5 mg p.o.  nightly   For insomnia-unstable Discontinue Klonopin for noncompliance. Start mirtazapine 7.5 mg p.o. nightly  For mild cognitive impairment-unstable He will continue to  follow-up with neurology.  Follow-up in clinic in 2 weeks or sooner if needed.  I have spent atleast 15 minutes face to face with patient today. More than 50 % of the time was spent for psychoeducation and supportive psychotherapy and care coordination.  This note was generated in part or whole with voice recognition software. Voice recognition is usually quite accurate but there are transcription errors that can and very often do occur. I apologize for any typographical errors that were not detected and corrected.        Ursula Alert, MD 08/16/2018, 5:27 PM

## 2018-08-16 NOTE — Patient Instructions (Signed)
Mirtazapine tablets What is this medicine? MIRTAZAPINE (mir TAZ a peen) is used to treat depression. This medicine may be used for other purposes; ask your health care provider or pharmacist if you have questions. COMMON BRAND NAME(S): Remeron What should I tell my health care provider before I take this medicine? They need to know if you have any of these conditions: -bipolar disorder -glaucoma -kidney disease -liver disease -suicidal thoughts -an unusual or allergic reaction to mirtazapine, other medicines, foods, dyes, or preservatives -pregnant or trying to get pregnant -breast-feeding How should I use this medicine? Take this medicine by mouth with a glass of water. Follow the directions on the prescription label. Take your medicine at regular intervals. Do not take your medicine more often than directed. Do not stop taking this medicine suddenly except upon the advice of your doctor. Stopping this medicine too quickly may cause serious side effects or your condition may worsen. A special MedGuide will be given to you by the pharmacist with each prescription and refill. Be sure to read this information carefully each time. Talk to your pediatrician regarding the use of this medicine in children. Special care may be needed. Overdosage: If you think you have taken too much of this medicine contact a poison control center or emergency room at once. NOTE: This medicine is only for you. Do not share this medicine with others. What if I miss a dose? If you miss a dose, take it as soon as you can. If it is almost time for your next dose, take only that dose. Do not take double or extra doses. What may interact with this medicine? Do not take this medicine with any of the following medications: -linezolid -MAOIs like Carbex, Eldepryl, Marplan, Nardil, and Parnate -methylene blue (injected into a vein) This medicine may also interact with the following medications: -alcohol -antiviral  medicines for HIV or AIDS -certain medicines that treat or prevent blood clots like warfarin -certain medicines for depression, anxiety, or psychotic disturbances -certain medicines for fungal infections like ketoconazole and itraconazole -certain medicines for migraine headache like almotriptan, eletriptan, frovatriptan, naratriptan, rizatriptan, sumatriptan, zolmitriptan -certain medicines for seizures like carbamazepine or phenytoin -certain medicines for sleep -cimetidine -erythromycin -fentanyl -lithium -medicines for blood pressure -nefazodone -rasagiline -rifampin -supplements like St. John's wort, kava kava, valerian -tramadol -tryptophan This list may not describe all possible interactions. Give your health care provider a list of all the medicines, herbs, non-prescription drugs, or dietary supplements you use. Also tell them if you smoke, drink alcohol, or use illegal drugs. Some items may interact with your medicine. What should I watch for while using this medicine? Tell your doctor if your symptoms do not get better or if they get worse. Visit your doctor or health care professional for regular checks on your progress. Because it may take several weeks to see the full effects of this medicine, it is important to continue your treatment as prescribed by your doctor. Patients and their families should watch out for new or worsening thoughts of suicide or depression. Also watch out for sudden changes in feelings such as feeling anxious, agitated, panicky, irritable, hostile, aggressive, impulsive, severely restless, overly excited and hyperactive, or not being able to sleep. If this happens, especially at the beginning of treatment or after a change in dose, call your health care professional. You may get drowsy or dizzy. Do not drive, use machinery, or do anything that needs mental alertness until you know how this medicine affects you. Do not   stand or sit up quickly, especially if  you are an older patient. This reduces the risk of dizzy or fainting spells. Alcohol may interfere with the effect of this medicine. Avoid alcoholic drinks. This medicine may cause dry eyes and blurred vision. If you wear contact lenses you may feel some discomfort. Lubricating drops may help. See your eye doctor if the problem does not go away or is severe. Your mouth may get dry. Chewing sugarless gum or sucking hard candy, and drinking plenty of water may help. Contact your doctor if the problem does not go away or is severe. What side effects may I notice from receiving this medicine? Side effects that you should report to your doctor or health care professional as soon as possible: -allergic reactions like skin rash, itching or hives, swelling of the face, lips, or tongue -anxious -changes in vision -chest pain -confusion -elevated mood, decreased need for sleep, racing thoughts, impulsive behavior -eye pain -fast, irregular heartbeat -feeling faint or lightheaded, falls -feeling agitated, angry, or irritable -fever or chills, sore throat -hallucination, loss of contact with reality -loss of balance or coordination -mouth sores -redness, blistering, peeling or loosening of the skin, including inside the mouth -restlessness, pacing, inability to keep still -seizures -stiff muscles -suicidal thoughts or other mood changes -trouble passing urine or change in the amount of urine -trouble sleeping -unusual bleeding or bruising -unusually weak or tired -vomiting Side effects that usually do not require medical attention (report to your doctor or health care professional if they continue or are bothersome): -change in appetite -constipation -dizziness -dry mouth -muscle aches or pains -nausea -tired -weight gain This list may not describe all possible side effects. Call your doctor for medical advice about side effects. You may report side effects to FDA at 1-800-FDA-1088. Where  should I keep my medicine? Keep out of the reach of children. Store at room temperature between 15 and 30 degrees C (59 and 86 degrees F) Protect from light and moisture. Throw away any unused medicine after the expiration date. NOTE: This sheet is a summary. It may not cover all possible information. If you have questions about this medicine, talk to your doctor, pharmacist, or health care provider.  2019 Elsevier/Gold Standard (2015-10-29 17:30:45) Serotonin Syndrome Serotonin is a chemical in your body (neurotransmitter) that helps to control several functions, such as:  Brain and nerve cell function.  Mood and emotions.  Memory.  Eating.  Sleeping.  Sexual activity.  Stress response. Having too much serotonin in your body can cause serotonin syndrome. This condition can be harmful to your brain and nerve cells. This can be a life-threatening condition. What are the causes? This condition may be caused by taking medicines or drugs that increase the level of serotonin in your body, such as:  Antidepressant medicines.  Migraine medicines.  Certain pain medicines.  Certain drugs, including ecstasy, LSD, cocaine, and amphetamines.  Over-the-counter cough or cold medicines that contain dextromethorphan.  Certain herbal supplements, including St. John's wort, ginseng, and nutmeg. This condition usually occurs when you take these medicines or drugs in combination, but it can also happen with a high dose of a single medicine or drug. What increases the risk? You are more likely to develop this condition if:  You just started taking a medicine or drug that increases the level of serotonin in the body.  You recently increased the dose of a medicine or drug that increases the level of serotonin in the body.  You take  more than one medicine or drug that increases the level of serotonin in the body. What are the signs or symptoms? Symptoms of this condition usually start within  several hours of taking a medicine or drug. Symptoms may be mild or severe. Mild symptoms include:  Sweating.  Restlessness or agitation.  Muscle twitching or stiffness.  Rapid heart rate.  Nausea and vomiting.  Diarrhea.  Headache.  Shivering or goose bumps.  Confusion. Severe symptoms include:  Irregular heartbeat.  Seizures.  Loss of consciousness.  High fever. How is this diagnosed? This condition may be diagnosed based on:  Your medical history.  A physical exam.  Your prior use of drugs and medicines.  Blood or urine tests. These may be used to rule out other causes of your symptoms. How is this treated? The treatment for this condition depends on the severity of your symptoms.  For mild cases, stopping the medicine or drug that caused your condition is usually all that is needed.  For moderate to severe cases, treatment in a hospital may be needed to prevent or manage life-threatening symptoms. This may include medicines to control your symptoms, IV fluids, interventions to support your breathing, and treatments to control your body temperature. Follow these instructions at home: Medicines   Take over-the-counter and prescription medicines only as told by your health care provider. This is important.  Check with your health care provider before you start taking any new prescriptions, over-the-counter medicines, herbs, or supplements.  Avoid combining any medicines that can cause this condition to occur. Lifestyle   Maintain a healthy lifestyle. ? Eat a healthy diet that includes plenty of vegetables, fruits, whole grains, low-fat dairy products, and lean protein. Do not eat a lot of foods that are high in fat, added sugars, or salt. ? Get the right amount and quality of sleep. Most adults need 7-9 hours of sleep each night. ? Make time to exercise, even if it is only for short periods of time. Most adults should exercise for at least 150 minutes each  week. ? Do not drink alcohol. ? Do not use illegal drugs, and do not take medicines for reasons other than they are prescribed. General instructions  Do not use any products that contain nicotine or tobacco, such as cigarettes and e-cigarettes. If you need help quitting, ask your health care provider.  Keep all follow-up visits as told by your health care provider. This is important. Contact a health care provider if:  Your symptoms do not improve or they get worse. Get help right away if you:  Have worsening confusion, severe headache, chest pain, high fever, seizures, or loss of consciousness.  Experience serious side effects of medicine, such as swelling of your face, lips, tongue, or throat.  Have serious thoughts about hurting yourself or others. These symptoms may represent a serious problem that is an emergency. Do not wait to see if the symptoms will go away. Get medical help right away. Call your local emergency services (911 in the [place] Do not drive yourself to the hospital. If you ever feel like you may hurt yourself or others, or have thoughts about taking your own life, get help right away. You can go to your nearest emergency department or call:  Your local emergency services (911 in the [place]  A suicide crisis helpline, such as the [facility] at 6078739711. This is open 24 hours a day. Summary  Serotonin is a brain chemical that helps to regulate the  nervous system. High levels of serotonin in the body can cause serotonin syndrome, which is a very dangerous condition.  This condition may be caused by taking medicines or drugs that increase the level of serotonin in your body.  Treatment depends on the severity of your symptoms. For mild cases, stopping the medicine or drug that caused your condition is usually all that is needed.  Check with your health care provider before you start taking any new prescriptions, over-the-counter  medicines, herbs, or supplements. This information is not intended to replace advice given to you by your health care provider. Make sure you discuss any questions you have with your health care provider. Document Released: 07/07/2004 Document Revised: 07/07/2017 Document Reviewed: 07/07/2017 Elsevier Interactive Patient Education  2019 [employer].

## 2018-08-20 ENCOUNTER — Telehealth: Payer: Self-pay | Admitting: Psychiatry

## 2018-08-20 ENCOUNTER — Telehealth: Payer: Self-pay

## 2018-08-20 NOTE — Telephone Encounter (Signed)
pt called left message states that with the medication you gave him his face went numb and his body is twiching. he states that he can not take ssri's and that is what you gave him.

## 2018-08-20 NOTE — Telephone Encounter (Signed)
Called patient left, message.  Discussed that Remeron is not an SSRI/SNRI. Discussed to stop it if he has side effects.

## 2018-08-22 ENCOUNTER — Ambulatory Visit: Payer: Self-pay | Admitting: Psychiatry

## 2018-08-22 ENCOUNTER — Other Ambulatory Visit: Payer: Self-pay

## 2018-08-22 ENCOUNTER — Ambulatory Visit: Payer: Self-pay | Admitting: Licensed Clinical Social Worker

## 2018-08-22 DIAGNOSIS — F411 Generalized anxiety disorder: Secondary | ICD-10-CM

## 2018-08-22 DIAGNOSIS — F339 Major depressive disorder, recurrent, unspecified: Secondary | ICD-10-CM

## 2018-08-22 DIAGNOSIS — F5105 Insomnia due to other mental disorder: Secondary | ICD-10-CM

## 2018-08-22 NOTE — Progress Notes (Signed)
Comprehensive Clinical Assessment (CCA) Note  08/22/2018 Frank Golden [id number]  Visit Diagnosis:      ICD-10-CM   1. GAD (generalized anxiety disorder) F41.1   2. Major depressive disorder, recurrent episode with anxious distress ([place]) F33.9   3. Insomnia due to mental disorder F51.05       CCA Part One  Part One has been completed on paper by the patient.  (See scanned document in Chart Review)  CCA Part Two A  Intake/Chief Complaint:  CCA Intake With Chief Complaint CCA Part Two Date: 08/22/18 CCA Part Two Time: 84 Chief Complaint/Presenting Problem: I have had anxiety all my life.  I struggle with keeping a job. I have had a few temp to perm jobs that I was not hired for.  I am now taking CBD oil.  I makde my own oil.  My mother struggled with anxiety as well.  I have taken medication for anxiety but have had several bad reactions.  Patient struggles with being withdrawn unable to socialize.  Difficulty with relaxation and being able to connect with others.  Unable to thrive in a crowd; has difficulty in [employer], crowded areas. Mother and Grandmother suffered from Anxiety.   Individual's Strengths: problem solving Individual's Preferences: money management Individual's Abilities: communicates well Type of Services Patient Feels Are Needed: therapy, medication  Mental Health Symptoms Depression:  Depression: Change in energy/activity, Sleep (too much or little), Increase/decrease in appetite  Mania:  Mania: N/A  Anxiety:   Anxiety: Worrying, Tension, Restlessness, Irritability, Fatigue, Difficulty concentrating  Psychosis:  Psychosis: N/A  Trauma:  Trauma: N/A  Obsessions:  Obsessions: N/A  Compulsions:  Compulsions: N/A  Inattention:  Inattention: N/A  Hyperactivity/Impulsivity:  Hyperactivity/Impulsivity: N/A  Oppositional/Defiant Behaviors:  Oppositional/Defiant Behaviors: N/A  Borderline Personality:     Other Mood/Personality Symptoms:      Mental Status  Exam Appearance and self-care  Stature:  Stature: Average  Weight:  Weight: Obese  Clothing:  Clothing: Casual  Grooming:  Grooming: Normal  Cosmetic use:  Cosmetic Use: Age appropriate  Posture/gait:  Posture/Gait: Normal  Motor activity:  Motor Activity: Not Remarkable  Sensorium  Attention:  Attention: Normal  Concentration:  Concentration: Normal  Orientation:  Orientation: X5  Recall/memory:  Recall/Memory: Normal  Affect and Mood  Affect:  Affect: Appropriate  Mood:  Mood: Pessimistic  Relating  Eye contact:  Eye Contact: Normal  Facial expression:  Facial Expression: Responsive  Attitude toward examiner:  Attitude Toward Examiner: Cooperative  Thought and Language  Speech flow: Speech Flow: Normal  Thought content:  Thought Content: Appropriate to mood and circumstances  Preoccupation:     Hallucinations:     Organization:     Transport planner of Knowledge:  Fund of Knowledge: Average  Intelligence:  Intelligence: Average  Abstraction:  Abstraction: Normal  Judgement:  Judgement: Fair  Art therapist:  Reality Testing: Adequate  Insight:  Insight: Fair  Decision Making:  Decision Making: Normal  Social Functioning  Social Maturity:     Social Judgement:     Stress  Stressors:  Stressors: Transitions, Work, Illness  Coping Ability:  Coping Ability: English as a second language teacher Deficits:     Supports:      Family and Psychosocial History: Family history Marital status: Divorced(currently married for the past 22 years) Divorced, when?: in 1997, "she had to find herself" Are you sexually active?: No What is your sexual orientation?: heterosexual Does patient have children?: Yes How many children?: 4(Elaine 52, Serella 33, [place], [place]  38ish) How is patient's relationship with their children?: has a good relationship with all the children.  Clint and [place] stepchildren  Childhood History:  Childhood History By whom was/is the patient raised?: Both  parents Additional childhood history information: Born in [place], [place].  Describes childhood: father worked a lot, 4 siblings,  Description of patient's relationship with caregiver when they were a child: mother: fine relationship; no bad memories.  Father: he was intellectual, he took me fishing Patient's description of current relationship with people who raised him/her: both deceased How were you disciplined when you got in trouble as a child/adolescent?: grounded, privileges taken away, a few spanking Does patient have siblings?: Yes Number of Siblings: 4(Anne 72, Peter 63, Tanya 58, Michael 56) Description of patient's current relationship with siblings: good relationship with all siblings.  Oldest lives 2 doors away Did patient suffer any verbal/emotional/physical/sexual abuse as a child?: No Did patient suffer from severe childhood neglect?: No Has patient ever been sexually abused/assaulted/raped as an adolescent or adult?: No Was the patient ever a victim of a crime or a disaster?: No Witnessed domestic violence?: No Has patient been effected by domestic violence as an adult?: No  CCA Part Two B  Employment/Work Situation: Employment / Work Copywriter, advertising Employment situation: (early retirement; trying to get disability) What is the longest time patient has a held a job?: 61yrs 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  Education: Education Name of [place]: [employer] Did Teacher, adult education From [employer]?: Yes Did Physicist, medical?: Yes What Type of College Degree Do you Have?: Associates 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 Did You Have An Individualized Education Program (IIEP): No Did You Have Any Difficulty At [employer]?: No  Religion: Religion/Spirituality Are You A Religious Person?: Yes What is Your Religious Affiliation?: Christian How Might This Affect Treatment?: denies  Leisure/Recreation: Leisure /  Recreation Leisure and Hobbies: movies, dinner  Exercise/Diet: Exercise/Diet Do You Exercise?: No Have You Gained or Lost A Significant Amount of Weight in the Past Six Months?: No Do You Follow a Special Diet?: No Do You Have Any Trouble Sleeping?: Yes Explanation of Sleeping Difficulties: difficulty falling and staying asleep  CCA Part Two C  Alcohol/Drug Use: Alcohol / Drug Use Pain Medications: see record Prescriptions: see record Over the Counter: see record History of alcohol / drug use?: (has a history of drug usage while in high school)                      CCA Part Three  ASAM's:  Six Dimensions of Multidimensional Assessment  Dimension 1:  Acute Intoxication and/or Withdrawal Potential:     Dimension 2:  Biomedical Conditions and Complications:     Dimension 3:  Emotional, Behavioral, or Cognitive Conditions and Complications:     Dimension 4:  Readiness to Change:     Dimension 5:  Relapse, Continued use, or Continued Problem Potential:     Dimension 6:  Recovery/Living Environment:      Substance use Disorder (SUD)    Social Function:     Stress:  Stress Stressors: Transitions, Work, Illness Coping Ability: Overwhelmed Patient Takes Medications The Way The Doctor Instructed?: Yes Priority Risk: Low Acuity  Risk Assessment- Self-Harm Potential: Risk Assessment For Self-Harm Potential Thoughts of Self-Harm: No current thoughts Method: No plan Availability of Means: No access/NA  Risk Assessment -Dangerous to Others Potential: Risk Assessment For Dangerous to Others Potential Method: No Plan  Availability of Means: No access or NA Intent: Vague intent or NA Notification Required: No need or identified person  DSM5 Diagnoses: Patient Active Problem List   Diagnosis Date Noted  . Hyperlipidemia 07/26/2018  . Depression 07/26/2018  . Allergy 07/26/2018  . Psoriasis 03/08/2018  . Polyarthralgia 03/08/2018  . Chronic insomnia 10/03/2016  .  Anxiety, generalized 10/03/2016  . Mild cognitive impairment with memory loss 10/03/2016  . 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 Hypothyroidism due to acquired atrophy of thyroid 11/23/2015  . Essential hypertension 11/23/2015  . Primary osteoarthritis involving multiple joints 11/23/2015  . COPD (chronic obstructive pulmonary disease) ([place]) 09/25/2014  . OSA on CPAP 09/25/2014  . Obesity 09/25/2014    Patient Centered Plan: Patient is on the following Treatment Plan(s):  Anxiety  Recommendations for Services/Supports/Treatments: Recommendations for Services/Supports/Treatments Recommendations For Services/Supports/Treatments: Medication Management, Individual Therapy  Treatment Plan Summary:    Referrals to Alternative Service(s): Referred to Alternative Service(s):   Place:   Date:   Time:    Referred to Alternative Service(s):   Place:   Date:   Time:    Referred to Alternative Service(s):   Place:   Date:   Time:    Referred to Alternative Service(s):   Place:   Date:   Time:     [place]

## 2018-08-22 NOTE — Telephone Encounter (Signed)
pt is here to see [place], lcsw and states that the medication is not working for him and he wants to speak with dr. Shea Evans while he is here.

## 2018-08-23 NOTE — Telephone Encounter (Signed)
Returned call to patient again, left message.

## 2018-08-28 ENCOUNTER — Ambulatory Visit: Payer: Self-pay | Admitting: Psychiatry

## 2018-08-28 ENCOUNTER — Encounter: Payer: Self-pay | Admitting: Psychiatry

## 2018-08-28 ENCOUNTER — Other Ambulatory Visit: Payer: Self-pay

## 2018-08-28 VITALS — BP 151/95 | HR 80 | Temp 98.8°F | Wt 225.4 lb

## 2018-08-28 DIAGNOSIS — F411 Generalized anxiety disorder: Secondary | ICD-10-CM

## 2018-08-28 DIAGNOSIS — F339 Major depressive disorder, recurrent, unspecified: Secondary | ICD-10-CM

## 2018-08-28 DIAGNOSIS — F5105 Insomnia due to other mental disorder: Secondary | ICD-10-CM

## 2018-08-28 DIAGNOSIS — G3184 Mild cognitive impairment, so stated: Secondary | ICD-10-CM

## 2018-08-28 MED ORDER — GABAPENTIN 300 MG PO CAPS: 300.0000 mg | ORAL_CAPSULE | ORAL | 0 refills | Status: DC

## 2018-08-28 NOTE — Progress Notes (Signed)
[facility] MD OP Progress Note  08/28/2018 5:41 PM Frank Golden  MRN:  [id number]  Chief Complaint: ' I am here for follow up." Chief Complaint    Follow-up; Medication Refill     HPI: Frank Golden is a 63 year old Caucasian male, unemployed, applied for disability, lives in [place], married, has a history of anxiety, depression, memory problems, hypothyroidism, spinal stenosis, arthritis, chronic pain, presented to clinic today for a follow-up visit.  Patient today reports he developed side effects to the mirtazapine.  He reports he had numbness of his face which continued for the next few days.  He thinks the mirtazapine may have caused it.  He had stopped taking it.  Patient reports he continues to struggle with anxiety symptoms.  He reports he is overwhelmed and nervous all the time.  He went for psychotherapy sessions but reports he and his therapist came to the conclusion that he can work on his anxiety symptoms himself at home since he knew all the techniques.  Patient however reports he has not started working on them on a regular basis.  Patient is already taking gabapentin for pain.  Patient with history of severe side effects to a lot of antidepressants and antianxiety agents especially SSRIs.  Hence discussed with patient that his gabapentin can be readjusted and he agrees with plan.  He will try a higher dosage of gabapentin 300 mg 4 times a day.  Also discussed starting meditation techniques and relaxation techniques.  Patient denies any other concerns today. Visit Diagnosis:    ICD-10-CM   1. GAD (generalized anxiety disorder) F41.1   2. Major depressive disorder, recurrent episode with anxious distress ([place]) F33.9   3. Insomnia due to mental disorder F51.05   4. 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  Past trials of Celexa, Klonopin.  He has tried at least 4 SSRIs  in the past and does not remember the names.  He also tried mirtazapine recently.  Past Medical History:  Past Medical History:  Diagnosis Date  . Allergic state   . Cancer ([place])    skin cancer on face  . Depression   . DJD (degenerative joint disease)   . ED (erectile dysfunction)   . History of chicken pox   . History of pancreatitis   . Hyperlipidemia   . Hypertension   . Hypertension   . Hypothyroid   . OCD (obsessive compulsive disorder)   . Psoriasis   . Rheumatoid arthritis ([place])   . Sleep apnea    uses CPAP    Past Surgical History:  Procedure Laterality Date  . APPENDECTOMY    . CYSTECTOMY    . endoscopic carpal tunnell    . LAMINECTOMY    . MOHS SURGERY     basal cell on nose    Family Psychiatric History: I have reviewed family psychiatric history from my progress note on 07/26/2018.  Family History:  Family History  Problem Relation Age of Onset  . COPD Mother   . Arthritis Mother   . Osteoporosis Mother   . Anxiety disorder Mother   . Depression Mother   . Stroke Father   . Cancer Father        penile  . Heart failure Father   . Aneurysm Father   . Hypertension Father   . Coronary artery disease Father   . Anxiety disorder Maternal Grandmother   .  Depression Maternal Grandmother     Social History: Reviewed social history from my progress note on 07/26/2018. Social History   Socioeconomic History  . Marital status: Married    Spouse name: marian  . Number of children: 1  . Years of education: Not on file  . Highest education level: Associate degree: occupational, Hotel manager, or vocational program  Occupational History  . Not on file  Social Needs  . Financial resource strain: Hard  . Food insecurity:    Worry: Often true    Inability: Often true  . Transportation needs:    Medical: No    Non-medical: No  Tobacco Use  . Smoking status: Former Smoker    Packs/day: 1.00    Years: 15.00    Pack years: 15.00    Types: Cigarettes     Last attempt to quit: 1980    Years since quitting: 40.2  . Smokeless tobacco: Never Used  Substance and Sexual Activity  . Alcohol use: Yes    Alcohol/week: 6.0 standard drinks    Types: 6 Shots of liquor per week  . Drug use: No  . Sexual activity: Yes  Lifestyle  . Physical activity:    Days per week: 0 days    Minutes per session: 0 min  . Stress: Very much  Relationships  . Social connections:    Talks on phone: Not on file    Gets together: Not on file    Attends religious service: Never    Active member of club or organization: Yes    Attends meetings of clubs or organizations: More than 4 times per year    Relationship status: Married  Other Topics Concern  . Not on file  Social History Narrative  . Not on file    Allergies:  Allergies  Allergen Reactions  . Other     Other reaction(s): Unknown Uncoded Allergy. Allergen: bee stings  . Quinapril Hcl     Other reaction(s): Cough  . Remeron [Mirtazapine]     NUMBNESS OF FACE    Metabolic Disorder Labs: Lab Results  Component Value Date   HGBA1C 6.1 12/25/2012   No results found for: PROLACTIN Lab Results  Component Value Date   CHOL 148 12/25/2012   TRIG 127 12/25/2012   HDL 62 (H) 12/25/2012   VLDL 25 12/25/2012   LDLCALC 61 12/25/2012   Lab Results  Component Value Date   TSH 0.740 12/25/2012    Therapeutic Level Labs: No results found for: LITHIUM No results found for: VALPROATE No components found for:  CBMZ  Current Medications: Current Outpatient Medications  Medication Sig Dispense Refill  . ALBUTEROL SULFATE IN Inhale into the lungs.    . B Complex Vitamins (B-COMPLEX/B-12) TABS Take by mouth.    . budesonide-formoterol (SYMBICORT) 160-4.5 MCG/ACT inhaler Inhale 2 puffs into the lungs 2 (two) times daily. 1 Inhaler 6  . Capsicum-Garlic (CAYENNE PLUS GARLIC PO) Take by mouth.    . Coenzyme Q-10 100 MG capsule Take by mouth.    . Coenzyme Q10 (COQ-10) 100 MG capsule Take 100 mg by  mouth daily. With red yeast rice.    Marland Kitchen EPINEPHrine 0.3 mg/0.3 mL IJ SOAJ injection Inject 0.3 mg into the muscle as needed.    . fluticasone (FLONASE) 50 MCG/ACT nasal spray Place 2 sprays into the nose daily.    Marland Kitchen gabapentin (NEURONTIN) 300 MG capsule Take 1-2 capsules (300-600 mg total) by mouth as directed. Take 1 capsule bid and 2 capsules PM  120 capsule 0  . Garlic 0865 MG CAPS Take by mouth.    . hydrochlorothiazide (HYDRODIURIL) 25 MG tablet Take 25 mg by mouth daily.    Marland Kitchen levothyroxine (SYNTHROID, LEVOTHROID) 112 MCG tablet Take 112 mcg by mouth daily.    . Methylsulfonylmethane 1500 MG TABS Take by mouth.    . Multiple Vitamins-Minerals (MULTIVITAMIN ADULT PO) Take by mouth.    . OMEGA-3 FATTY ACIDS PO Take 1 capsule by mouth daily.    Vladimir Faster Glycol-Propyl Glycol 0.4-0.3 % SOLN Apply to eye.    . saw palmetto 160 MG capsule Take 1 capsule by mouth daily.    . traMADol (ULTRAM) 50 MG tablet Take by mouth.    . Turmeric, Curcuma Longa, (TURMERIC ROOT) POWD Take by mouth.    . valsartan (DIOVAN) 320 MG tablet Take 320 mg by mouth daily.    Marland Kitchen VITAMIN C, CALCIUM ASCORBATE, PO Take by mouth.    . vitamin E 1000 UNIT capsule Take 1 capsule by mouth daily.    . vitamin E 400 UNIT capsule Take by mouth.    Marland Kitchen ZINC GLUCONATE PO Take by mouth.    Marland Kitchen albuterol (PROVENTIL HFA;VENTOLIN HFA) 108 (90 BASE) MCG/ACT inhaler Inhale 2 puffs into the lungs as needed.     No current facility-administered medications for this visit.      Musculoskeletal: Strength & Muscle Tone: within normal limits Gait & Station: normal Patient leans: N/A  Psychiatric Specialty Exam: Review of Systems  Psychiatric/Behavioral: The patient is nervous/anxious.   All other systems reviewed and are negative.   Blood pressure (!) 151/95, pulse 80, temperature 98.8 F (37.1 C), temperature source Oral, weight 225 lb 6.4 oz (102.2 kg).Body mass index is 35.84 kg/m.  General Appearance: Casual  Eye Contact:  Fair   Speech:  Clear and Coherent  Volume:  Normal  Mood:  Anxious  Affect:  Congruent  Thought Process:  Goal Directed and Descriptions of Associations: Intact  Orientation:  Full (Time, Place, and Person)  Thought Content: Logical   Suicidal Thoughts:  No  Homicidal Thoughts:  No  Memory:  Immediate;   Fair Recent;   Fair Remote;   Fair  Judgement:  Fair  Insight:  Fair  Psychomotor Activity:  Normal  Concentration:  Concentration: Fair and Attention Span: Fair  Recall:  [employer] of Knowledge: Fair  Language: Fair  Akathisia:  No  Handed:  Right  AIMS (if indicated):denies tremors, rigidity,stiffness  Assets:  Communication Skills Desire for Improvement Social Support  ADL's:  Intact  Cognition: WNL  Sleep:  Fair   Screenings:   Assessment and Plan: Frank Golden is a 63 year old Caucasian male, married, unemployed, applied for disability, presented to clinic today for a follow-up visit.  Patient with history of depression, anxiety, cognitive impairment, hypothyroidism, pituitary lesion, OSA on CPAP, lumbar stenosis, presented to clinic today for a follow-up visit.  Patient is biologically predisposed given his multiple medical problems, mental health problems in his family.  Patient also has psychosocial stressors of medical problems and financial issues.  Patient did not tolerate the mirtazapine and continues to struggle with anxiety symptoms.  Plan as noted below.  Plan GAD-unstable Discontinue mirtazapine for side effects. Start increased dosage of gabapentin 300 mg 4 times a day. Continue CBT.  For MDD-unstable Continue CBT.  For insomnia- stable Discontinue mirtazapine for side effects. We will monitor closely.  For mild cognitive impairment-unstable He will continue to follow-up with neurology.  I also spoke to  Ms. Peacock's therapist to coordinate care.  Per Ms. Peacock patient was unable to afford his therapy sessions here and hence has been noncompliant.  Patient  was provided information about patient assistance program.  Follow-up in clinic in 2 weeks or sooner if needed.  I have spent atleast 25 minutes face to face with patient today. More than 50 % of the time was spent for psychoeducation and supportive psychotherapy and care coordination.  This note was generated in part or whole with voice recognition software. Voice recognition is usually quite accurate but there are transcription errors that can and very often do occur. I apologize for any typographical errors that were not detected and corrected.     Ursula Alert, MD 08/28/2018, 5:41 PM

## 2018-09-12 ENCOUNTER — Encounter: Payer: Self-pay | Admitting: Psychiatry

## 2018-09-12 ENCOUNTER — Ambulatory Visit (INDEPENDENT_AMBULATORY_CARE_PROVIDER_SITE_OTHER): Payer: Self-pay | Admitting: Psychiatry

## 2018-09-12 ENCOUNTER — Other Ambulatory Visit: Payer: Self-pay

## 2018-09-12 DIAGNOSIS — F411 Generalized anxiety disorder: Secondary | ICD-10-CM

## 2018-09-12 DIAGNOSIS — F339 Major depressive disorder, recurrent, unspecified: Secondary | ICD-10-CM

## 2018-09-12 DIAGNOSIS — F5105 Insomnia due to other mental disorder: Secondary | ICD-10-CM

## 2018-09-12 DIAGNOSIS — G3184 Mild cognitive impairment, so stated: Secondary | ICD-10-CM

## 2018-09-12 MED ORDER — QUETIAPINE FUMARATE 25 MG PO TABS: 12.5000 mg | ORAL_TABLET | Freq: Every day | ORAL | 2 refills | Status: DC

## 2018-09-12 NOTE — Progress Notes (Signed)
TC on  09-12-18 @ 1:40 spoke with patient. Patient states doing ok but still having issues with sleeping.  Medical and surgery hx was reviewed, no changes.   Medications were review and updated.  Vitals was not done at this time due to this is a phone consult.

## 2018-09-12 NOTE — Progress Notes (Signed)
Virtual Visit via Telephone Note  I connected with Frank Golden on 09/12/18 at  2:15 PM EDT by telephone and verified that I am speaking with the correct person using two identifiers.   I discussed the limitations, risks, security and privacy concerns of performing an evaluation and management service by telephone and the availability of in person appointments. I also discussed with the patient that there may be a patient responsible charge related to this service. The patient expressed understanding and agreed to proceed.    I discussed the assessment and treatment plan with the patient. The patient was provided an opportunity to ask questions and all were answered. The patient agreed with the plan and demonstrated an understanding of the instructions.   The patient was advised to call back or seek an in-person evaluation if the symptoms worsen or if the condition fails to improve as anticipated.  I provided 15 minutes of non-face-to-face time during this encounter.   Ursula Alert, MD  [facility] MD OP Progress Note  09/12/2018 2:36 PM Frank Golden  MRN:  [id number]  Chief Complaint:  Chief Complaint    Follow-up; Insomnia     HPI: Frank Golden is a 63 year old Caucasian male, unemployed, applied for disability, lives in [place], married, has a history of anxiety, depression, memory problems, hypothyroidism, spinal stenosis, arthritis, chronic pain was evaluated by phone today.  Patient reports he continues to struggle with anxiety and sleep problems.  He reports he is on a higher dosage of gabapentin.  He has not noticed much benefit from the same.  Patient reports he is also overwhelmed about the COVID-19 crisis.  He stays home all day and that is also a stressor for him.  Patient denies any suicidality, homicidality or perceptual disturbances.  Discuss starting Seroquel low-dose.  He agrees with plan.  Discussed referral for CBT with [facility] or in the community.  Patient reports he will let  writer know when he is ready.  Visit Diagnosis:    ICD-10-CM   1. GAD (generalized anxiety disorder) F41.1 QUEtiapine (SEROQUEL) 25 MG tablet  2. Major depressive disorder, recurrent episode with anxious distress ([facility]) F33.9 QUEtiapine (SEROQUEL) 25 MG tablet  3. Insomnia due to mental disorder F51.05 QUEtiapine (SEROQUEL) 25 MG tablet  4. 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  Past trials of Celexa, Klonopin, Remeron.  He has tried at least 4 SSRIs in the past and does not remember the names.  Past Medical History:  Past Medical History:  Diagnosis Date  . Allergic state   . Cancer ([place])    skin cancer on face  . Depression   . DJD (degenerative joint disease)   . ED (erectile dysfunction)   . History of chicken pox   . History of pancreatitis   . Hyperlipidemia   . Hypertension   . Hypertension   . Hypothyroid   . OCD (obsessive compulsive disorder)   . Psoriasis   . Rheumatoid arthritis ([place])   . Sleep apnea    uses CPAP    Past Surgical History:  Procedure Laterality Date  . APPENDECTOMY    . CYSTECTOMY    . endoscopic carpal tunnell    . LAMINECTOMY    . MOHS SURGERY     basal cell on nose    Family Psychiatric History: Reviewed family psychiatric history from my progress note on 07/26/2018.  Family History:  Family History  Problem Relation Age of Onset  . COPD Mother   . Arthritis Mother   . Osteoporosis Mother   . Anxiety disorder Mother   . Depression Mother   . Stroke Father   . Cancer Father        penile  . Heart failure Father   . Aneurysm Father   . Hypertension Father   . Coronary artery disease Father   . Anxiety disorder Maternal Grandmother   . Depression Maternal Grandmother     Social History: I have reviewed social history from my progress note on 07/26/2018. Social History   Socioeconomic History  . Marital  status: Married    Spouse name: Frank Golden  . Number of children: 1  . Years of education: Not on file  . Highest education level: Associate degree: occupational, Hotel manager, or vocational program  Occupational History  . Not on file  Social Needs  . Financial resource strain: Hard  . Food insecurity:    Worry: Often true    Inability: Often true  . Transportation needs:    Medical: No    Non-medical: No  Tobacco Use  . Smoking status: Former Smoker    Packs/day: 1.00    Years: 15.00    Pack years: 15.00    Types: Cigarettes    Last attempt to quit: 1980    Years since quitting: 40.2  . Smokeless tobacco: Never Used  Substance and Sexual Activity  . Alcohol use: Yes    Alcohol/week: 6.0 standard drinks    Types: 6 Shots of liquor per week  . Drug use: No  . Sexual activity: Yes  Lifestyle  . Physical activity:    Days per week: 0 days    Minutes per session: 0 min  . Stress: Very much  Relationships  . Social connections:    Talks on phone: Not on file    Gets together: Not on file    Attends religious service: Never    Active member of club or organization: Yes    Attends meetings of clubs or organizations: More than 4 times per year    Relationship status: Married  Other Topics Concern  . Not on file  Social History Narrative  . Not on file    Allergies:  Allergies  Allergen Reactions  . Other     Other reaction(s): Unknown Uncoded Allergy. Allergen: bee stings  . Quinapril Hcl     Other reaction(s): Cough  . Remeron [Mirtazapine]     NUMBNESS OF FACE    Metabolic Disorder Labs: Lab Results  Component Value Date   HGBA1C 6.1 12/25/2012   No results found for: PROLACTIN Lab Results  Component Value Date   CHOL 148 12/25/2012   TRIG 127 12/25/2012   HDL 62 (H) 12/25/2012   VLDL 25 12/25/2012   LDLCALC 61 12/25/2012   Lab Results  Component Value Date   TSH 0.740 12/25/2012    Therapeutic Level Labs: No results found for: LITHIUM No results  found for: VALPROATE No components found for:  CBMZ  Current Medications: Current Outpatient Medications  Medication Sig Dispense Refill  . ALBUTEROL SULFATE IN Inhale into the lungs.    . B Complex Vitamins (B-COMPLEX/B-12) TABS Take by mouth.    . budesonide-formoterol (SYMBICORT) 160-4.5 MCG/ACT inhaler Inhale 2 puffs into the lungs 2 (two) times daily. 1 Inhaler 6  . Capsicum-Garlic (CAYENNE PLUS GARLIC PO) Take by mouth.    . Coenzyme Q-10 100 MG capsule Take by mouth.    Marland Kitchen  Coenzyme Q10 (COQ-10) 100 MG capsule Take 100 mg by mouth daily. With red yeast rice.    Marland Kitchen EPINEPHrine 0.3 mg/0.3 mL IJ SOAJ injection Inject 0.3 mg into the muscle as needed.    . fluticasone (FLONASE) 50 MCG/ACT nasal spray Place 2 sprays into the nose daily.    Marland Kitchen gabapentin (NEURONTIN) 300 MG capsule Take 1-2 capsules (300-600 mg total) by mouth as directed. Take 1 capsule bid and 2 capsules PM (Patient taking differently: Take 300-600 mg by mouth 4 (four) times daily. Take 1 capsule bid and 2 capsules PM) 120 capsule 0  . Garlic 3825 MG CAPS Take by mouth.    . hydrochlorothiazide (HYDRODIURIL) 25 MG tablet Take 25 mg by mouth daily.    Marland Kitchen levothyroxine (SYNTHROID, LEVOTHROID) 112 MCG tablet Take 112 mcg by mouth daily.    . Methylsulfonylmethane 1500 MG TABS Take by mouth.    . Multiple Vitamins-Minerals (MULTIVITAMIN ADULT PO) Take by mouth.    . OMEGA-3 FATTY ACIDS PO Take 1 capsule by mouth daily.    Vladimir Faster Glycol-Propyl Glycol 0.4-0.3 % SOLN Apply to eye.    . saw palmetto 160 MG capsule Take 1 capsule by mouth daily.    . traMADol (ULTRAM) 50 MG tablet Take by mouth.    . Turmeric, Curcuma Longa, (TURMERIC ROOT) POWD Take by mouth.    . valsartan (DIOVAN) 320 MG tablet Take 320 mg by mouth daily.    Marland Kitchen VITAMIN C, CALCIUM ASCORBATE, PO Take by mouth.    . vitamin E 1000 UNIT capsule Take 1 capsule by mouth daily.    . vitamin E 400 UNIT capsule Take by mouth.    Marland Kitchen ZINC GLUCONATE PO Take by mouth.     Marland Kitchen albuterol (PROVENTIL HFA;VENTOLIN HFA) 108 (90 BASE) MCG/ACT inhaler Inhale 2 puffs into the lungs as needed.    Marland Kitchen QUEtiapine (SEROQUEL) 25 MG tablet Take 0.5-1 tablets (12.5-25 mg total) by mouth at bedtime. For sleep and anxiety 15 tablet 2   No current facility-administered medications for this visit.      Musculoskeletal: Strength & Muscle Tone: UTA Gait & Station: UTA Patient leans: UTA  Psychiatric Specialty Exam: Review of Systems  Psychiatric/Behavioral: The patient is nervous/anxious and has insomnia.   All other systems reviewed and are negative.   There were no vitals taken for this visit.There is no height or weight on file to calculate BMI.  General Appearance: UTA  Eye Contact:  UTA  Speech:  Clear and Coherent  Volume:  Normal  Mood:  Anxious  Affect:  UTA  Thought Process:  Goal Directed and Descriptions of Associations: Intact  Orientation:  Full (Time, Place, and Person)  Thought Content: Logical   Suicidal Thoughts:  No  Homicidal Thoughts:  No  Memory:  Immediate;   Fair Recent;   Fair Remote;   Fair  Judgement:  Fair  Insight:  Fair  Psychomotor Activity:  UTA  Concentration:  Concentration: Fair and Attention Span: Fair  Recall:  [employer] of Knowledge: Fair  Language: Fair  Akathisia:  No  Handed:  Right  AIMS (if indicated): UTA  Assets:  Communication Skills Desire for Improvement Social Support  ADL's:  Intact  Cognition: WNL  Sleep:  Poor   Screenings:   Assessment and Plan: Ollie is a 63 year old Caucasian male, married, unemployed, applied for disability, was evaluated in clinic today for a follow-up visit.  Patient with history of depression, anxiety, cognitive impairment, hypothyroidism, pituitary lesion,  OSA on CPAP, lumbar stenosis.  Patient is biologically predisposed given his multiple medical problems, mental health problems in his family.  Patient also has psychosocial stressors of medical problems and financial issues and  also the current COVID-19 crisis.  Patient is interested in medication changes today, discussed plan as noted below.  Plan For GAD- unstable Gabapentin 300 mg 4 times a day. Start Seroquel 25 mg p.o. nightly. Discussed with him to restart psychotherapy sessions.  For MDD- some progress Continue CBT.  For insomnia-unstable Start Seroquel 12.5 to 25 mg p.o. nightly.  Mild cognitive impairment-chronic he will continue to follow-up with neurology.  Follow-up in clinic in 2 to 4 weeks or sooner if needed.  I have spent atleast 15 minutes non- face to face with patient today. More than 50 % of the time was spent for psychoeducation and supportive psychotherapy and care coordination.  This note was generated in part or whole with voice recognition software. Voice recognition is usually quite accurate but there are transcription errors that can and very often do occur. I apologize for any typographical errors that were not detected and corrected.       Ursula Alert, MD 09/12/2018, 2:36 PM

## 2018-10-03 ENCOUNTER — Telehealth: Payer: Self-pay

## 2018-10-03 NOTE — Telephone Encounter (Signed)
pt called left message that he stopped taking the quetiapine because it was increasing his panic attachs and anxiety levels .

## 2018-10-03 NOTE — Telephone Encounter (Signed)
Ok please schedule an appt to be evaluated. thnks

## 2018-10-04 NOTE — Telephone Encounter (Signed)
Ok [facility]

## 2018-10-04 NOTE — Telephone Encounter (Signed)
Pt has appt 10-20-18

## 2018-10-17 ENCOUNTER — Encounter: Payer: Self-pay | Admitting: Psychiatry

## 2018-10-17 ENCOUNTER — Ambulatory Visit (INDEPENDENT_AMBULATORY_CARE_PROVIDER_SITE_OTHER): Payer: Self-pay | Admitting: Psychiatry

## 2018-10-17 ENCOUNTER — Other Ambulatory Visit: Payer: Self-pay

## 2018-10-17 DIAGNOSIS — F5105 Insomnia due to other mental disorder: Secondary | ICD-10-CM

## 2018-10-17 DIAGNOSIS — G3184 Mild cognitive impairment, so stated: Secondary | ICD-10-CM

## 2018-10-17 DIAGNOSIS — F411 Generalized anxiety disorder: Secondary | ICD-10-CM

## 2018-10-17 DIAGNOSIS — F339 Major depressive disorder, recurrent, unspecified: Secondary | ICD-10-CM

## 2018-10-17 MED ORDER — AMITRIPTYLINE HCL 10 MG PO TABS: 10.0000 mg | ORAL_TABLET | Freq: Every day | ORAL | 0 refills | Status: DC

## 2018-10-17 NOTE — Progress Notes (Signed)
Virtual Visit via Video Note  I connected with Frank Golden on 10/17/18 at  3:00 PM EDT by a video enabled telemedicine application and verified that I am speaking with the correct person using two identifiers.   I discussed the limitations of evaluation and management by telemedicine and the availability of in person appointments. The patient expressed understanding and agreed to proceed.  I discussed the assessment and treatment plan with the patient. The patient was provided an opportunity to ask questions and all were answered. The patient agreed with the plan and demonstrated an understanding of the instructions.   The patient was advised to call back or seek an in-person evaluation if the symptoms worsen or if the condition fails to improve as anticipated.   [facility] MD OP Progress Note  10/17/2018 5:40 PM Frank Golden  MRN:  [id number]  Chief Complaint:  Chief Complaint    Follow-up     HPI: Frank Golden is a 63 year old Caucasian male, unemployed, applied for disability, lives in [place], married, has a history of depression, anxiety, cognitive problems, insomnia, hypothyroidism, spinal stenosis, arthritis, chronic pain was evaluated by telemedicine today.  Patient today reports he continues to struggle with anxiety symptoms.  He had to stop taking the Seroquel since he had side effects.  Patient reports that the Seroquel made his anxiety attacks worse.  He reports he also takes gabapentin for pain.  He reports he is not sure if the gabapentin is also giving him some of the side effects or not.  He continues to struggle with sleep.  He reports his sleep continues to be interrupted.  He does try to work on sleep hygiene however that has not been very helpful.  Discussed starting Elavil for his sleep as well as anxiety symptoms.  Provided medication education.  Patient agrees with plan.  Patient denies any other concerns today. Visit Diagnosis:    ICD-10-CM   1. GAD (generalized anxiety  disorder) F41.1 amitriptyline (ELAVIL) 10 MG tablet  2. Major depressive disorder, recurrent episode with anxious distress ([place]) F33.9   3. Insomnia due to mental disorder F51.05 amitriptyline (ELAVIL) 10 MG tablet  4. 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  Past trials of Celexa, Klonopin, mirtazapine, Seroquel.  Patient also has tried at least 4 SSRIs in the past-does not remember names.  Past Medical History:  Past Medical History:  Diagnosis Date  . Allergic state   . Cancer ([place])    skin cancer on face  . Depression   . DJD (degenerative joint disease)   . ED (erectile dysfunction)   . History of chicken pox   . History of pancreatitis   . Hyperlipidemia   . Hypertension   . Hypertension   . Hypothyroid   . OCD (obsessive compulsive disorder)   . Psoriasis   . Rheumatoid arthritis ([place])   . Sleep apnea    uses CPAP    Past Surgical History:  Procedure Laterality Date  . APPENDECTOMY    . CYSTECTOMY    . endoscopic carpal tunnell    . LAMINECTOMY    . MOHS SURGERY     basal cell on nose    Family Psychiatric History: Reviewed family psychiatric history from my progress note on 07/26/2018.  Family History:  Family History  Problem Relation Age of Onset  . COPD Mother   . Arthritis Mother   . Osteoporosis Mother   .  Anxiety disorder Mother   . Depression Mother   . Stroke Father   . Cancer Father        penile  . Heart failure Father   . Aneurysm Father   . Hypertension Father   . Coronary artery disease Father   . Anxiety disorder Maternal Grandmother   . Depression Maternal Grandmother     Social History: Reviewed social history from my progress note on 07/26/2018. Social History   Socioeconomic History  . Marital status: Married    Spouse name: marian  . Number of children: 1  . Years of education: Not on file  . Highest education  level: Associate degree: occupational, Hotel manager, or vocational program  Occupational History  . Not on file  Social Needs  . Financial resource strain: Hard  . Food insecurity:    Worry: Often true    Inability: Often true  . Transportation needs:    Medical: No    Non-medical: No  Tobacco Use  . Smoking status: Former Smoker    Packs/day: 1.00    Years: 15.00    Pack years: 15.00    Types: Cigarettes    Last attempt to quit: 1980    Years since quitting: 40.3  . Smokeless tobacco: Never Used  Substance and Sexual Activity  . Alcohol use: Yes    Alcohol/week: 6.0 standard drinks    Types: 6 Shots of liquor per week  . Drug use: No  . Sexual activity: Yes  Lifestyle  . Physical activity:    Days per week: 0 days    Minutes per session: 0 min  . Stress: Very much  Relationships  . Social connections:    Talks on phone: Not on file    Gets together: Not on file    Attends religious service: Never    Active member of club or organization: Yes    Attends meetings of clubs or organizations: More than 4 times per year    Relationship status: Married  Other Topics Concern  . Not on file  Social History Narrative  . Not on file    Allergies:  Allergies  Allergen Reactions  . Other     Other reaction(s): Unknown Uncoded Allergy. Allergen: bee stings  . Quinapril Hcl     Other reaction(s): Cough  . Remeron [Mirtazapine]     NUMBNESS OF FACE    Metabolic Disorder Labs: Lab Results  Component Value Date   HGBA1C 6.1 12/25/2012   No results found for: PROLACTIN Lab Results  Component Value Date   CHOL 148 12/25/2012   TRIG 127 12/25/2012   HDL 62 (H) 12/25/2012   VLDL 25 12/25/2012   LDLCALC 61 12/25/2012   Lab Results  Component Value Date   TSH 0.740 12/25/2012    Therapeutic Level Labs: No results found for: LITHIUM No results found for: VALPROATE No components found for:  CBMZ  Current Medications: Current Outpatient Medications  Medication  Sig Dispense Refill  . albuterol (PROVENTIL HFA;VENTOLIN HFA) 108 (90 BASE) MCG/ACT inhaler Inhale 2 puffs into the lungs as needed.    . ALBUTEROL SULFATE IN Inhale into the lungs.    Marland Kitchen amitriptyline (ELAVIL) 10 MG tablet Take 1 tablet (10 mg total) by mouth at bedtime. 30 tablet 0  . B Complex Vitamins (B-COMPLEX/B-12) TABS Take by mouth.    . budesonide-formoterol (SYMBICORT) 160-4.5 MCG/ACT inhaler Inhale 2 puffs into the lungs 2 (two) times daily. 1 Inhaler 6  . Capsicum-Garlic (CAYENNE PLUS GARLIC  PO) Take by mouth.    . Coenzyme Q-10 100 MG capsule Take by mouth.    . Coenzyme Q10 (COQ-10) 100 MG capsule Take 100 mg by mouth daily. With red yeast rice.    Marland Kitchen EPINEPHrine 0.3 mg/0.3 mL IJ SOAJ injection Inject 0.3 mg into the muscle as needed.    . fluticasone (FLONASE) 50 MCG/ACT nasal spray Place 2 sprays into the nose daily.    Marland Kitchen gabapentin (NEURONTIN) 300 MG capsule Take 1-2 capsules (300-600 mg total) by mouth as directed. Take 1 capsule bid and 2 capsules PM (Patient taking differently: Take 300-600 mg by mouth 4 (four) times daily. Take 1 capsule bid and 2 capsules PM) 120 capsule 0  . Garlic 6720 MG CAPS Take by mouth.    . hydrochlorothiazide (HYDRODIURIL) 25 MG tablet Take 25 mg by mouth daily.    Marland Kitchen levothyroxine (SYNTHROID, LEVOTHROID) 112 MCG tablet Take 112 mcg by mouth daily.    . Methylsulfonylmethane 1500 MG TABS Take by mouth.    . Multiple Vitamins-Minerals (MULTIVITAMIN ADULT PO) Take by mouth.    . OMEGA-3 FATTY ACIDS PO Take 1 capsule by mouth daily.    Vladimir Faster Glycol-Propyl Glycol 0.4-0.3 % SOLN Apply to eye.    . saw palmetto 160 MG capsule Take 1 capsule by mouth daily.    . traMADol (ULTRAM) 50 MG tablet Take by mouth.    . Turmeric, Curcuma Longa, (TURMERIC ROOT) POWD Take by mouth.    . valsartan (DIOVAN) 320 MG tablet Take 320 mg by mouth daily.    Marland Kitchen VITAMIN C, CALCIUM ASCORBATE, PO Take by mouth.    . vitamin E 1000 UNIT capsule Take 1 capsule by mouth  daily.    . vitamin E 400 UNIT capsule Take by mouth.    Marland Kitchen ZINC GLUCONATE PO Take by mouth.     No current facility-administered medications for this visit.      Musculoskeletal: Strength & Muscle Tone: within normal limits Gait & Station: normal Patient leans: N/A  Psychiatric Specialty Exam: Review of Systems  Musculoskeletal: Positive for back pain.  Psychiatric/Behavioral: The patient is nervous/anxious and has insomnia.   All other systems reviewed and are negative.   There were no vitals taken for this visit.There is no height or weight on file to calculate BMI.  General Appearance: Casual  Eye Contact:  Good  Speech:  Clear and Coherent  Volume:  Normal  Mood:  Anxious  Affect:  Congruent  Thought Process:  Goal Directed and Descriptions of Associations: Intact  Orientation:  Full (Time, Place, and Person)  Thought Content: Logical   Suicidal Thoughts:  No  Homicidal Thoughts:  No  Memory:  Immediate;   Fair Recent;   Fair Remote;   Fair  Judgement:  Fair  Insight:  Fair  Psychomotor Activity:  Normal  Concentration:  Concentration: Fair and Attention Span: Fair  Recall:  [employer] of Knowledge: Fair  Language: Fair  Akathisia:  No  Handed:  Right  AIMS (if indicated): Denies tremors, rigidity,stiffness  Assets:  Communication Skills Desire for Improvement Social Support  ADL's:  Intact  Cognition: WNL  Sleep:  Poor   Screenings:   Assessment and Plan: Braydon is a 63 year old Caucasian male, married, unemployed, applied for disability, was evaluated by telemedicine today.  Patient with history of depression, anxiety, cognitive impairment, hypothyroidism, pituitary lesion, OSA on CPAP, lumbar stenosis.  Patient is biologically predisposed given his multiple medical problems, mental health problems in  his family.  He also has psychosocial stressors of medical problems and financial issues as well as the current COVID-19 crisis.  Patient will continue to  benefit from medication readjustment since he struggles with sleep and anxiety.  Plan as noted below.  Plan For GAD-unstable Continue gabapentin 300 mg 4 times a day which he takes for pain but also helps with anxiety symptoms. Discontinue Seroquel for side effects. Start Elavil 10 mg p.o. nightly.  For MDD- some progress We will continue to monitor.  For insomnia-unstable Start Elavil 10 mg p.o. nightly. Advised to continue melatonin.  For mild cognitive impairment-chronic he will continue to follow-up with neurology.  Patient was referred for CBT previously however does not seem motivated to continue the same.  Follow-up in clinic in 2 to 4 weeks or sooner if needed.  I have spent atleast 25 minutes non face to face with patient today. More than 50 % of the time was spent for psychoeducation and supportive psychotherapy and care coordination.  This note was generated in part or whole with voice recognition software. Voice recognition is usually quite accurate but there are transcription errors that can and very often do occur. I apologize for any typographical errors that were not detected and corrected.        Ursula Alert, MD 10/18/2018, 8:56 AM

## 2018-10-18 ENCOUNTER — Encounter: Payer: Self-pay | Admitting: Psychiatry

## 2018-11-15 ENCOUNTER — Ambulatory Visit: Payer: Self-pay | Admitting: Psychiatry

## 2018-11-15 ENCOUNTER — Other Ambulatory Visit: Payer: Self-pay

## 2018-11-19 ENCOUNTER — Other Ambulatory Visit: Payer: Self-pay

## 2018-11-19 ENCOUNTER — Ambulatory Visit (INDEPENDENT_AMBULATORY_CARE_PROVIDER_SITE_OTHER): Payer: Self-pay | Admitting: Psychiatry

## 2018-11-19 ENCOUNTER — Encounter: Payer: Self-pay | Admitting: Psychiatry

## 2018-11-19 DIAGNOSIS — F5105 Insomnia due to other mental disorder: Secondary | ICD-10-CM

## 2018-11-19 DIAGNOSIS — F339 Major depressive disorder, recurrent, unspecified: Secondary | ICD-10-CM

## 2018-11-19 DIAGNOSIS — G3184 Mild cognitive impairment, so stated: Secondary | ICD-10-CM

## 2018-11-19 DIAGNOSIS — F411 Generalized anxiety disorder: Secondary | ICD-10-CM

## 2018-11-19 MED ORDER — PROPRANOLOL HCL 10 MG PO TABS: 10.0000 mg | ORAL_TABLET | Freq: Three times a day (TID) | ORAL | 1 refills | Status: DC | PRN

## 2018-11-19 NOTE — Progress Notes (Signed)
Virtual Visit via Video Note  I connected with Frank Golden on 11/19/18 at  2:45 PM EDT by a video enabled telemedicine application and verified that I am speaking with the correct person using two identifiers.   I discussed the limitations of evaluation and management by telemedicine and the availability of in person appointments. The patient expressed understanding and agreed to proceed.    I discussed the assessment and treatment plan with the patient. The patient was provided an opportunity to ask questions and all were answered. The patient agreed with the plan and demonstrated an understanding of the instructions.   The patient was advised to call back or seek an in-person evaluation if the symptoms worsen or if the condition fails to improve as anticipated.   [facility] MD OP Progress Note  11/19/2018 5:40 PM Frank Golden  MRN:  [id number]  Chief Complaint:  Chief Complaint    Follow-up     HPI: Frank Golden is a 63 year old Caucasian male, unemployed, applied for disability, lives in [place], married, has a history of GAD, MDD, insomnia, cognitive problems, hypothyroidism, spinal stenosis, arthritis, chronic pain was evaluated by telemedicine today.  Patient reports he did not tolerate the Elavil well.  He reports it made him sleep more than 16 hours.  He hence stopped taking it.  He continues to struggle with anxiety symptoms, racing thoughts as well as sleep problems.  Patient reports he is interested in a new medication today.  Patient advised to take melatonin over-the-counter.  He reports he has extended release melatonin available and will try to take it to see if that will help him with his sleep.  Discuss starting propranolol as needed for anxiety symptoms.  He reports his application for patient assistance has been approved and he would like to start therapy sessions with Ms. Peacock again.  Discussed with patient to start more frequent therapy sessions for his anxiety symptoms since he  is very sensitive to medications in general.  Patient denies any suicidality, homicidality or perceptual disturbances. Visit Diagnosis:    ICD-10-CM   1. GAD (generalized anxiety disorder) F41.1 propranolol (INDERAL) 10 MG tablet  2. Major depressive disorder, recurrent episode with anxious distress ([place]) F33.9   3. Insomnia due to mental disorder F51.05   4. 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  Past trials of Elavil ,Celexa, Klonopin, mirtazapine, Seroquel.  Patient also has tried at least 4 SSRIs in the past-does not remember names.  Past Medical History:  Past Medical History:  Diagnosis Date  . Allergic state   . Cancer ([place])    skin cancer on face  . Depression   . DJD (degenerative joint disease)   . ED (erectile dysfunction)   . History of chicken pox   . History of pancreatitis   . Hyperlipidemia   . Hypertension   . Hypertension   . Hypothyroid   . OCD (obsessive compulsive disorder)   . Psoriasis   . Rheumatoid arthritis ([place])   . Sleep apnea    uses CPAP    Past Surgical History:  Procedure Laterality Date  . APPENDECTOMY    . CYSTECTOMY    . endoscopic carpal tunnell    . LAMINECTOMY    . MOHS SURGERY     basal cell on nose    Family Psychiatric History: Reviewed family psychiatric history from my progress note on 07/26/2018.  Family History:  Family History  Problem Relation Age of Onset  . COPD Mother   . Arthritis Mother   . Osteoporosis Mother   . Anxiety disorder Mother   . Depression Mother   . Stroke Father   . Cancer Father        penile  . Heart failure Father   . Aneurysm Father   . Hypertension Father   . Coronary artery disease Father   . Anxiety disorder Maternal Grandmother   . Depression Maternal Grandmother     Social History: Reviewed social history from my progress note on 07/26/2018. Social History    Socioeconomic History  . Marital status: Married    Spouse name: marian  . Number of children: 1  . Years of education: Not on file  . Highest education level: Associate degree: occupational, Hotel manager, or vocational program  Occupational History  . Not on file  Social Needs  . Financial resource strain: Hard  . Food insecurity:    Worry: Often true    Inability: Often true  . Transportation needs:    Medical: No    Non-medical: No  Tobacco Use  . Smoking status: Former Smoker    Packs/day: 1.00    Years: 15.00    Pack years: 15.00    Types: Cigarettes    Last attempt to quit: 1980    Years since quitting: 40.4  . Smokeless tobacco: Never Used  Substance and Sexual Activity  . Alcohol use: Yes    Alcohol/week: 6.0 standard drinks    Types: 6 Shots of liquor per week  . Drug use: No  . Sexual activity: Yes  Lifestyle  . Physical activity:    Days per week: 0 days    Minutes per session: 0 min  . Stress: Very much  Relationships  . Social connections:    Talks on phone: Not on file    Gets together: Not on file    Attends religious service: Never    Active member of club or organization: Yes    Attends meetings of clubs or organizations: More than 4 times per year    Relationship status: Married  Other Topics Concern  . Not on file  Social History Narrative  . Not on file    Allergies:  Allergies  Allergen Reactions  . Other     Other reaction(s): Unknown Uncoded Allergy. Allergen: bee stings  . Quinapril Hcl     Other reaction(s): Cough  . Remeron [Mirtazapine]     NUMBNESS OF FACE    Metabolic Disorder Labs: Lab Results  Component Value Date   HGBA1C 6.1 12/25/2012   No results found for: PROLACTIN Lab Results  Component Value Date   CHOL 148 12/25/2012   TRIG 127 12/25/2012   HDL 62 (H) 12/25/2012   VLDL 25 12/25/2012   LDLCALC 61 12/25/2012   Lab Results  Component Value Date   TSH 0.740 12/25/2012    Therapeutic Level Labs: No  results found for: LITHIUM No results found for: VALPROATE No components found for:  CBMZ  Current Medications: Current Outpatient Medications  Medication Sig Dispense Refill  . albuterol (PROVENTIL HFA;VENTOLIN HFA) 108 (90 BASE) MCG/ACT inhaler Inhale 2 puffs into the lungs as needed.    . ALBUTEROL SULFATE IN Inhale into the lungs.    . B Complex Vitamins (B-COMPLEX/B-12) TABS Take by mouth.    . budesonide-formoterol (SYMBICORT) 160-4.5 MCG/ACT inhaler Inhale 2 puffs into the lungs 2 (two) times daily. 1  Inhaler 6  . Capsicum-Garlic (CAYENNE PLUS GARLIC PO) Take by mouth.    . Coenzyme Q-10 100 MG capsule Take by mouth.    . Coenzyme Q10 (COQ-10) 100 MG capsule Take 100 mg by mouth daily. With red yeast rice.    Marland Kitchen EPINEPHrine 0.3 mg/0.3 mL IJ SOAJ injection Inject 0.3 mg into the muscle as needed.    . fluticasone (FLONASE) 50 MCG/ACT nasal spray Place 2 sprays into the nose daily.    Marland Kitchen gabapentin (NEURONTIN) 300 MG capsule Take 1-2 capsules (300-600 mg total) by mouth as directed. Take 1 capsule bid and 2 capsules PM (Patient taking differently: Take 300-600 mg by mouth 4 (four) times daily. Take 1 capsule bid and 2 capsules PM) 120 capsule 0  . Garlic 8916 MG CAPS Take by mouth.    . hydrochlorothiazide (HYDRODIURIL) 25 MG tablet Take 25 mg by mouth daily.    Marland Kitchen levothyroxine (SYNTHROID, LEVOTHROID) 112 MCG tablet Take 112 mcg by mouth daily.    . Methylsulfonylmethane 1500 MG TABS Take by mouth.    . Multiple Vitamins-Minerals (MULTIVITAMIN ADULT PO) Take by mouth.    . OMEGA-3 FATTY ACIDS PO Take 1 capsule by mouth daily.    Vladimir Faster Glycol-Propyl Glycol 0.4-0.3 % SOLN Apply to eye.    . propranolol (INDERAL) 10 MG tablet Take 1 tablet (10 mg total) by mouth 3 (three) times daily as needed. 90 tablet 1  . saw palmetto 160 MG capsule Take 1 capsule by mouth daily.    . traMADol (ULTRAM) 50 MG tablet Take by mouth.    . Turmeric, Curcuma Longa, (TURMERIC ROOT) POWD Take by mouth.     . valsartan (DIOVAN) 320 MG tablet Take 320 mg by mouth daily.    Marland Kitchen VITAMIN C, CALCIUM ASCORBATE, PO Take by mouth.    . vitamin E 1000 UNIT capsule Take 1 capsule by mouth daily.    . vitamin E 400 UNIT capsule Take by mouth.    Marland Kitchen ZINC GLUCONATE PO Take by mouth.     No current facility-administered medications for this visit.      Musculoskeletal: Strength & Muscle Tone: within normal limits Gait & Station: normal Patient leans: N/A  Psychiatric Specialty Exam: Review of Systems  Psychiatric/Behavioral: The patient is nervous/anxious and has insomnia.   All other systems reviewed and are negative.   There were no vitals taken for this visit.There is no height or weight on file to calculate BMI.  General Appearance: Casual  Eye Contact:  Fair  Speech:  Normal Rate  Volume:  Normal  Mood:  Anxious  Affect:  Congruent  Thought Process:  Goal Directed and Descriptions of Associations: Intact  Orientation:  Full (Time, Place, and Person)  Thought Content: Logical   Suicidal Thoughts:  No  Homicidal Thoughts:  No  Memory:  Immediate , recent , remote - wnl  Judgement:  Fair  Insight:  Good  Psychomotor Activity:  Normal  Concentration:  Concentration: Fair and Attention Span: Fair  Recall:  [employer] of Knowledge: Fair  Language: Fair  Akathisia:  No  Handed:  Right  AIMS (if indicated): Denies tremors, rigidity, stiffness  Assets:  Communication Skills Desire for Improvement Housing Social Support  ADL's:  Intact  Cognition: WNL  Sleep:  Poor   Screenings:   Assessment and Plan: Frank Golden is a 63 yr old  male, married, unemployed, applied for disability, was evaluated by telemedicine today.  Patient with history of GAD, MDD,  cognitive impairment, hypothyroidism, pituitary lesion, OSA on CPAP, lumbar stenosis.  Patient is biologically predisposed given his multiple medical problems and financial issues as well as current COVID-19 outbreak.  Patient continues to  struggle with anxiety and sleep problems.  We will continue to benefit from medication management as well as psychotherapy sessions.  Plan GAD-unstable Continue gabapentin 300 mg 4 times a day which he takes for pain. Discontinue Elavil for lack of efficacy and side effects. Start propranolol 10 mg p.o. 3 times daily as needed for anxiety attacks Discussed with patient to start seeing Ms. Peacock more frequently.  For MDD- some progress We will continue to monitor closely.  For insomnia-unstable Start melatonin over-the-counter.  For mild cognitive impairment-chronic-she will continue to work with his neurologist.  Follow-up in clinic in 3 to 4 weeks or sooner if needed.  July 6 at 2 PM  I have spent atleast 15 minutes non face to face with patient today. More than 50 % of the time was spent for psychoeducation and supportive psychotherapy and care coordination.  This note was generated in part or whole with voice recognition software. Voice recognition is usually quite accurate but there are transcription errors that can and very often do occur. I apologize for any typographical errors that were not detected and corrected.       Ursula Alert, MD 11/19/2018, 5:40 PM

## 2018-12-07 ENCOUNTER — Telehealth: Payer: Self-pay

## 2018-12-07 DIAGNOSIS — F411 Generalized anxiety disorder: Secondary | ICD-10-CM

## 2018-12-07 MED ORDER — BUSPIRONE HCL 7.5 MG PO TABS: 7.5000 mg | ORAL_TABLET | Freq: Two times a day (BID) | ORAL | 0 refills | Status: DC

## 2018-12-07 NOTE — Telephone Encounter (Signed)
pt left message that the propranolol is not working and he wants to quit taking.

## 2018-12-07 NOTE — Telephone Encounter (Signed)
Will start Buspar - pt agrees.

## 2018-12-11 ENCOUNTER — Ambulatory Visit: Payer: Self-pay | Admitting: Licensed Clinical Social Worker

## 2018-12-11 ENCOUNTER — Other Ambulatory Visit: Payer: Self-pay

## 2018-12-12 ENCOUNTER — Telehealth: Payer: Self-pay

## 2018-12-12 NOTE — Telephone Encounter (Signed)
Please let patient know to stop medications and please let him know to just stay in psychotherapy more frequently or he could get a genesight testing done- whatever he would prefer to do. thanks

## 2018-12-12 NOTE — Telephone Encounter (Signed)
pt left message that he was not takin the buspirone. he does not like how the medications are affecting him

## 2018-12-17 ENCOUNTER — Other Ambulatory Visit: Payer: Self-pay

## 2018-12-17 ENCOUNTER — Encounter: Payer: Self-pay | Admitting: Psychiatry

## 2018-12-17 ENCOUNTER — Ambulatory Visit (INDEPENDENT_AMBULATORY_CARE_PROVIDER_SITE_OTHER): Payer: Self-pay | Admitting: Psychiatry

## 2018-12-17 DIAGNOSIS — G3184 Mild cognitive impairment, so stated: Secondary | ICD-10-CM

## 2018-12-17 DIAGNOSIS — F5105 Insomnia due to other mental disorder: Secondary | ICD-10-CM

## 2018-12-17 DIAGNOSIS — F411 Generalized anxiety disorder: Secondary | ICD-10-CM

## 2018-12-17 DIAGNOSIS — F339 Major depressive disorder, recurrent, unspecified: Secondary | ICD-10-CM

## 2018-12-17 NOTE — Progress Notes (Signed)
Virtual Visit via Video Note  I connected with Frank Golden on 12/17/18 at  2:00 PM EDT by a video enabled telemedicine application and verified that I am speaking with the correct person using two identifiers.   I discussed the limitations of evaluation and management by telemedicine and the availability of in person appointments. The patient expressed understanding and agreed to proceed.   I discussed the assessment and treatment plan with the patient. The patient was provided an opportunity to ask questions and all were answered. The patient agreed with the plan and demonstrated an understanding of the instructions.   The patient was advised to call back or seek an in-person evaluation if the symptoms worsen or if the condition fails to improve as anticipated.   Mar[facility] MD OP Progress Note  12/17/2018 6:32 PM Frank Golden  MRN:  [id number]  Chief Complaint:  Chief Complaint    Follow-up     HPI: Frank Golden is a 63 year old Caucasian male, unemployed, applied for disability, lives in [place], has a history of GAD, MDD, insomnia, cognitive problems, hypothyroidism, spinal stenosis, chronic pain, arthritis was evaluated by telemedicine today.  Patient was offered video call however due to connection problem it had to be changed to a phone call.   Patient reports that he had serious side effects to Buspar and hence stopped taking it.  Patient with serious side effects to multiple medication trials.  Patient continues to struggle with anxiety symptoms.  Patient also reports sleep is restless however some improvement on the melatonin which he takes now.  Discussed with patient that since he is very sensitive to medications would recommend seeing his therapist more frequently.  Would recommend getting a GeneSight testing done.  Provided him information for the same.  He agrees with plan.  He agrees to reach out to therapist .  Agrees to get the genetic testing done today.   Visit Diagnosis:   ICD-10-CM   1. GAD (generalized anxiety disorder)  F41.1   2. Major depressive disorder, recurrent episode with anxious distress (Frank Golden)  F33.9   3. Insomnia due to mental disorder  F51.05   4. MCI (mild cognitive impairment) with memory loss  G31.84     Past Psychiatric History: Reviewed past psychiatric history from my progress note on 07/26/2018.  Past trials of Elavil, Celexa, Klonopin, mirtazapine, Seroquel, BuSpar.  Patient has tried several SSRIs in the past but does not remember names  Past Medical History:  Past Medical History:  Diagnosis Date  . Allergic state   . Cancer ([place])    skin cancer on face  . Depression   . DJD (degenerative joint disease)   . ED (erectile dysfunction)   . History of chicken pox   . History of pancreatitis   . Hyperlipidemia   . Hypertension   . Hypertension   . Hypothyroid   . OCD (obsessive compulsive disorder)   . Psoriasis   . Rheumatoid arthritis ([place])   . Sleep apnea    uses CPAP    Past Surgical History:  Procedure Laterality Date  . APPENDECTOMY    . CYSTECTOMY    . endoscopic carpal tunnell    . LAMINECTOMY    . MOHS SURGERY     basal cell on nose    Family Psychiatric History: Reviewed family psychiatric history from my progress note on 07/26/2018. Family History:  Family History  Problem Relation Age of Onset  . COPD Mother   . Arthritis Mother   . Osteoporosis Mother   .  Anxiety disorder Mother   . Depression Mother   . Stroke Father   . Cancer Father        penile  . Heart failure Father   . Aneurysm Father   . Hypertension Father   . Coronary artery disease Father   . Anxiety disorder Maternal Grandmother   . Depression Maternal Grandmother     Social History: Reviewed social history from my progress note on 07/26/2018. Social History   Socioeconomic History  . Marital status: Married    Spouse name: marian  . Number of children: 1  . Years of education: Not on file  . Highest education level: Associate  degree: occupational, Hotel manager, or vocational program  Occupational History  . Not on file  Social Needs  . Financial resource strain: Hard  . Food insecurity    Worry: Often true    Inability: Often true  . Transportation needs    Medical: No    Non-medical: No  Tobacco Use  . Smoking status: Former Smoker    Packs/day: 1.00    Years: 15.00    Pack years: 15.00    Types: Cigarettes    Quit date: 1980    Years since quitting: 40.5  . Smokeless tobacco: Never Used  Substance and Sexual Activity  . Alcohol use: Yes    Alcohol/week: 6.0 standard drinks    Types: 6 Shots of liquor per week  . Drug use: No  . Sexual activity: Yes  Lifestyle  . Physical activity    Days per week: 0 days    Minutes per session: 0 min  . Stress: Very much  Relationships  . Social Herbalist on phone: Not on file    Gets together: Not on file    Attends religious service: Never    Active member of club or organization: Yes    Attends meetings of clubs or organizations: More than 4 times per year    Relationship status: Married  Other Topics Concern  . Not on file  Social History Narrative  . Not on file    Allergies:  Allergies  Allergen Reactions  . Other     Other reaction(s): Unknown Uncoded Allergy. Allergen: bee stings  . Quinapril Hcl     Other reaction(s): Cough  . Remeron [Mirtazapine]     NUMBNESS OF FACE    Metabolic Disorder Labs: Lab Results  Component Value Date   HGBA1C 6.1 12/25/2012   No results found for: PROLACTIN Lab Results  Component Value Date   CHOL 148 12/25/2012   TRIG 127 12/25/2012   HDL 62 (H) 12/25/2012   VLDL 25 12/25/2012   LDLCALC 61 12/25/2012   Lab Results  Component Value Date   TSH 0.740 12/25/2012    Therapeutic Level Labs: No results found for: LITHIUM No results found for: VALPROATE No components found for:  CBMZ  Current Medications: Current Outpatient Medications  Medication Sig Dispense Refill  . albuterol  (PROVENTIL HFA;VENTOLIN HFA) 108 (90 BASE) MCG/ACT inhaler Inhale 2 puffs into the lungs as needed.    . ALBUTEROL SULFATE IN Inhale into the lungs.    . B Complex Vitamins (B-COMPLEX/B-12) TABS Take by mouth.    . budesonide-formoterol (SYMBICORT) 160-4.5 MCG/ACT inhaler Inhale 2 puffs into the lungs 2 (two) times daily. 1 Inhaler 6  . Cannabidiol 100 MG/ML SOLN Take by mouth.    . Capsicum-Garlic (CAYENNE PLUS GARLIC PO) Take by mouth.    . Coenzyme Q-10 100  MG capsule Take by mouth.    . Coenzyme Q10 (COQ-10) 100 MG capsule Take 100 mg by mouth daily. With red yeast rice.    Marland Kitchen EPINEPHrine 0.3 mg/0.3 mL IJ SOAJ injection Inject 0.3 mg into the muscle as needed.    . fluticasone (FLONASE) 50 MCG/ACT nasal spray Place 2 sprays into the nose daily.    . Garlic 6578 MG CAPS Take by mouth.    . hydrochlorothiazide (HYDRODIURIL) 25 MG tablet Take 25 mg by mouth daily.    Javier Docker Oil/Astaxanthin 1000 MG CAPS Take by mouth.    . levothyroxine (SYNTHROID, LEVOTHROID) 112 MCG tablet Take 112 mcg by mouth daily.    . Methylsulfonylmethane 1500 MG TABS Take by mouth.    . Multiple Vitamins-Minerals (MULTIVITAMIN ADULT PO) Take by mouth.    . OMEGA-3 FATTY ACIDS PO Take 1 capsule by mouth daily.    Vladimir Faster Glycol-Propyl Glycol 0.4-0.3 % SOLN Apply to eye.    . saw palmetto 160 MG capsule Take 1 capsule by mouth daily.    . traMADol (ULTRAM) 50 MG tablet Take by mouth.    . Turmeric, Curcuma Longa, (TURMERIC ROOT) POWD Take by mouth.    . valsartan (DIOVAN) 320 MG tablet Take 320 mg by mouth daily.    Marland Kitchen VITAMIN C, CALCIUM ASCORBATE, PO Take by mouth.    . vitamin E 1000 UNIT capsule Take 1 capsule by mouth daily.    . vitamin E 400 UNIT capsule Take by mouth.    Marland Kitchen ZINC GLUCONATE PO Take by mouth.     No current facility-administered medications for this visit.      Musculoskeletal: Strength & Muscle Tone: UTA Gait & Station: UTA Patient leans: N/A  Psychiatric Specialty Exam: Review of  Systems  Psychiatric/Behavioral: The patient is nervous/anxious.   All other systems reviewed and are negative.   There were no vitals taken for this visit.There is no height or weight on file to calculate BMI.  General Appearance: UTA  Eye Contact:  UTA  Speech:  Normal Rate  Volume:  Normal  Mood:  Anxious  Affect:  UTA  Thought Process:  Goal Directed and Descriptions of Associations: Intact  Orientation:  Full (Time, Place, and Person)  Thought Content: Logical   Suicidal Thoughts:  No  Homicidal Thoughts:  No  Memory:  Immediate;   Fair Recent;   Fair Remote;   Fair  Judgement:  Fair  Insight:  Fair  Psychomotor Activity:  UTA  Concentration:  Concentration: Fair and Attention Span: Fair  Recall:  [employer] of Knowledge: Fair  Language: Fair  Akathisia:  No  Handed:  Right  AIMS (if indicated): denies tremors, rigidity  Assets:  Communication Skills Desire for Improvement Housing Social Support  ADL's:  Intact  Cognition: WNL  Sleep:  Restless on and off   Screenings:   Assessment and Plan: Frank Golden is a 63 year old male, married, unemployed, applied for disability, was evaluated by telemedicine today.  Patient with history of GAD, MDD, hypothyroidism, cognitive impairment, pituitary lesion, OSA on CPAP, lumbar stenosis continues to struggle with medication side effects.  Hence discussed to start psychotherapy sessions and to also get genetic testing done.  He agrees with plan.  Plan GAD-unstable Patient is no longer on any medications. Refer for CBT with therapist more frequently. We will get genetic testing done, order GeneSight testing today.  MDD- some progress Continue CBT  For insomnia-some progress Continue melatonin as needed  Focal mild  cognitive impairment-chronic -continue to work with neurologist.  Follow-up in clinic as needed.  I have spent atleast 15 minutes non face to face with patient today. More than 50 % of the time was spent for  psychoeducation and supportive psychotherapy and care coordination.  This note was generated in part or whole with voice recognition software. Voice recognition is usually quite accurate but there are transcription errors that can and very often do occur. I apologize for any typographical errors that were not detected and corrected.        Ursula Alert, MD 12/17/2018, 6:32 PM

## 2019-01-23 ENCOUNTER — Other Ambulatory Visit: Payer: Self-pay

## 2019-01-23 ENCOUNTER — Ambulatory Visit (INDEPENDENT_AMBULATORY_CARE_PROVIDER_SITE_OTHER): Payer: Self-pay | Admitting: Licensed Clinical Social Worker

## 2019-01-23 DIAGNOSIS — F411 Generalized anxiety disorder: Secondary | ICD-10-CM

## 2019-04-13 NOTE — Progress Notes (Signed)
Virtual Visit via Telephone Note  I connected with Frank Golden on 01/23/2019 at  1:00 PM EDT by telephone and verified that I am speaking with the correct person using two identifiers.  Location: Patient: office Provider: home   I discussed the limitations, risks, security and privacy concerns of performing an evaluation and management service by telephone and the availability of in person appointments. I also discussed with the patient that there may be a patient responsible charge related to this service. The patient expressed understanding and agreed to proceed.       Participation Level: Active  Type of Therapy: Individual Therapy  Treatment Goals addressed: Anxiety  Interventions: CBT and Motivational Interviewing  Summary: Frank Golden is a 63 y.o. male who presents with continued symptoms of his diagnosis. Therapist met with Patient in an initial therapy session to assess current mood and to build rapport. Therapist engaged Patient in discussion about his life and what is going well for. Therapist provided support for Patient as he shared details about life,current stressors, mood, coping skills, and his MH history. Therapist prompted Patient to discuss his support system and ways that he manages daily stress, anger, and frustrations. LCSW discussed what psychotherapy is and is not and the importance of the therapeutic relationship to include open and honest communication between client and therapist and building trust.  Reviewed advantages and disadvantages of the therapeutic process and limitations to the therapeutic relationship including LCSW's role in maintaining the safety of the client, others and those in client's care.   Suicidal/Homicidal: No  Plan: Return again in 2 weeks.  Diagnosis: Axis I: Generalized Anxiety Disorder    Axis II: No diagnosis    I discussed the assessment and treatment plan with the patient. The patient was provided an opportunity to ask  questions and all were answered. The patient agreed with the plan and demonstrated an understanding of the instructions.   The patient was advised to call back or seek an in-person evaluation if the symptoms worsen or if the condition fails to improve as anticipated.  I provided 60 minutes of non-face-to-face time during this encounter.   Lubertha South, LCSW

## 2020-10-06 ENCOUNTER — Other Ambulatory Visit: Payer: Self-pay | Admitting: Internal Medicine

## 2020-10-06 DIAGNOSIS — E237 Disorder of pituitary gland, unspecified: Secondary | ICD-10-CM

## 2020-10-06 DIAGNOSIS — R519 Headache, unspecified: Secondary | ICD-10-CM

## 2020-10-14 ENCOUNTER — Other Ambulatory Visit: Payer: Self-pay

## 2020-10-14 ENCOUNTER — Ambulatory Visit
Admission: RE | Admit: 2020-10-14 | Discharge: 2020-10-14 | Disposition: A | Discharge status: Home / Self Care | Payer: Medicare Other | Source: Ambulatory Visit | Attending: Internal Medicine | Admitting: Internal Medicine

## 2020-10-14 DIAGNOSIS — R519 Headache, unspecified: Secondary | ICD-10-CM | POA: Insufficient documentation

## 2020-10-14 DIAGNOSIS — E237 Disorder of pituitary gland, unspecified: Secondary | ICD-10-CM | POA: Insufficient documentation

## 2020-10-14 MED ORDER — GADOBUTROL 1 MMOL/ML IV SOLN
10.0000 mL | Freq: Once | INTRAVENOUS | Status: AC | PRN
  Administered 2020-10-14: 10 mL via INTRAVENOUS

## 2021-07-20 ENCOUNTER — Other Ambulatory Visit: Payer: Self-pay | Admitting: Physical Medicine & Rehabilitation

## 2021-07-20 DIAGNOSIS — M542 Cervicalgia: Secondary | ICD-10-CM

## 2021-07-27 ENCOUNTER — Ambulatory Visit
Admission: RE | Admit: 2021-07-27 | Discharge: 2021-07-27 | Disposition: A | Discharge status: Home / Self Care | Payer: Medicare Other | Source: Ambulatory Visit | Attending: Physical Medicine & Rehabilitation | Admitting: Physical Medicine & Rehabilitation

## 2021-07-27 DIAGNOSIS — M542 Cervicalgia: Secondary | ICD-10-CM | POA: Diagnosis present

## 2021-08-03 ENCOUNTER — Other Ambulatory Visit: Payer: Self-pay | Admitting: Physical Medicine & Rehabilitation

## 2021-08-03 DIAGNOSIS — M542 Cervicalgia: Secondary | ICD-10-CM

## 2021-08-06 ENCOUNTER — Other Ambulatory Visit: Payer: Self-pay

## 2021-08-06 ENCOUNTER — Ambulatory Visit
Admission: RE | Admit: 2021-08-06 | Discharge: 2021-08-06 | Disposition: A | Discharge status: Home / Self Care | Payer: Medicare Other | Source: Ambulatory Visit | Attending: Physical Medicine & Rehabilitation | Admitting: Physical Medicine & Rehabilitation

## 2021-08-06 DIAGNOSIS — M542 Cervicalgia: Secondary | ICD-10-CM

## 2021-08-06 MED ORDER — TRIAMCINOLONE ACETONIDE 40 MG/ML IJ SUSP (RADIOLOGY)
60.0000 mg | Freq: Once | INTRAMUSCULAR | Status: AC
Start: 2021-08-06 — End: 2021-08-06
  Administered 2021-08-06: 60 mg via EPIDURAL

## 2021-08-06 MED ORDER — IOPAMIDOL (ISOVUE-M 300) INJECTION 61%
1.0000 mL | Freq: Once | INTRAMUSCULAR | Status: AC
  Administered 2021-08-06: 1 mL via EPIDURAL

## 2021-08-06 NOTE — Discharge Instructions (Signed)

## 2021-12-03 ENCOUNTER — Other Ambulatory Visit: Payer: Self-pay | Admitting: Orthopedic Surgery

## 2021-12-17 ENCOUNTER — Encounter
Admission: RE | Admit: 2021-12-17 | Discharge: 2021-12-17 | Disposition: A | Discharge status: Home / Self Care | Payer: Medicare Other | Source: Ambulatory Visit | Attending: Orthopedic Surgery | Admitting: Orthopedic Surgery

## 2021-12-17 ENCOUNTER — Other Ambulatory Visit: Payer: Self-pay

## 2021-12-17 VITALS — BP 115/96 | HR 95 | Resp 16 | Ht 67.5 in | Wt 238.0 lb

## 2021-12-17 DIAGNOSIS — Z01818 Encounter for other preprocedural examination: Secondary | ICD-10-CM | POA: Insufficient documentation

## 2021-12-17 DIAGNOSIS — Z01810 Encounter for preprocedural cardiovascular examination: Secondary | ICD-10-CM | POA: Diagnosis not present

## 2021-12-17 HISTORY — DX: Unspecified asthma, uncomplicated: J45.909

## 2021-12-17 LAB — URINALYSIS, ROUTINE W REFLEX MICROSCOPIC
Bilirubin Urine: NEGATIVE
Glucose, UA: NEGATIVE mg/dL
Hgb urine dipstick: NEGATIVE
Ketones, ur: 5 mg/dL — AB
Leukocytes,Ua: NEGATIVE
Nitrite: NEGATIVE
Protein, ur: NEGATIVE mg/dL
Specific Gravity, Urine: 1.027 (ref 1.005–1.030)
pH: 5 (ref 5.0–8.0)

## 2021-12-17 LAB — SURGICAL PCR SCREEN
MRSA, PCR: NEGATIVE
Staphylococcus aureus: NEGATIVE

## 2021-12-17 LAB — TYPE AND SCREEN
ABO/RH(D): A POS
Antibody Screen: NEGATIVE

## 2021-12-17 NOTE — Patient Instructions (Addendum)
Your procedure is scheduled on: 12/28/21 Report to [facility]. To find out your arrival time please call [phone] between 1PM - 3PM on 12/27/21 .  Remember: Instructions that are not followed completely may result in serious medical risk, up to and including death, or upon the discretion of your surgeon and anesthesiologist your surgery may need to be rescheduled.     _X__ 1. Do not eat food after midnight the night before your procedure.                 No gum chewing or hard candies. You may drink clear liquids up to 2 hours                 before you are scheduled to arrive for your surgery- DO not drink clear                 liquids within 2 hours of the start of your surgery.                 Clear Liquids include:  water, apple juice without pulp, clear carbohydrate                 drink such as Clearfast or Gatorade, Black Coffee or Tea (Do not add                 anything to coffee or tea). Diabetics water only  2 hours prior to arrival drink the ENSURE PRE SURGERY drink  __X__2.  On the morning of surgery brush your teeth with toothpaste and water, you                 may rinse your mouth with mouthwash if you wish.  Do not swallow any              toothpaste of mouthwash.     _X__ 3.  No Alcohol for 24 hours before or after surgery.   _X__ 4.  Do Not Smoke or use e-cigarettes For 24 Hours Prior to Your Surgery.                 Do not use any chewable tobacco products for at least 6 hours prior to                 surgery.  ____  5.  Bring all medications with you on the day of surgery if instructed.   __X__  6.  Notify your doctor if there is any change in your medical condition      (cold, fever, infections).     Do not wear jewelry, make-up, hairpins, clips or nail polish. Do not wear lotions, powders, or perfumes. May wear deodorant Do not shave body hair 48 hours prior to surgery. Men may shave face and neck. Do  not bring valuables to the hospital.    [facility] is not responsible for any belongings or valuables.  Contacts, dentures/partials or body piercings may not be worn into surgery. Bring a case for your contacts, glasses or hearing aids, a denture cup will be supplied. Leave your suitcase in the car. After surgery it may be brought to your room. For patients admitted to the hospital, discharge time is determined by your treatment team.   Patients discharged the day of surgery will not be allowed to drive home.   Please read over the following fact sheets that you were given:   MRSA  Information, CHG soap, Incentive Spirometer  __X__ Take these medicines the morning of surgery with A SIP OF WATER:    1. amLODipine (NORVASC) 5 MG tablet  2. busPIRone (BUSPAR) 30 MG tablet  3. cetirizine (ZYRTEC) 10 MG tablet  4. Levothyroxine Sodium 125 MCG CAPS  5.  6.  ____ Fleet Enema (as directed)   __X__ Use CHG Soap/SAGE wipes as directed  __X__ Use inhalers on the day of surgery    Formoterol  ____ Stop metformin/Janumet/Farxiga 2 days prior to surgery    ____ Take 1/2 of usual insulin dose the night before surgery. No insulin the morning          of surgery.   ____ Stop Blood Thinners Coumadin/Plavix/Xarelto/Pleta/Pradaxa/Eliquis/Effient/Aspirin  on   Or contact your Surgeon, Cardiologist or Medical Doctor regarding  ability to stop your blood thinners  __X__ Stop Anti-inflammatories 7 days before surgery such as Advil, Ibuprofen, Motrin,  BC or Goodies Powder, Naprosyn, Naproxen, Aleve, Aspirin STOP YOUR MELOXICAM ON 12/21/21   __X__ Stop all herbals and supplements, fish oil or vitamins  for 7 days until after surgery.   STOP ON 12/21/21   __X__ Bring C-Pap to the hospital.   YOU MAY TAKE TYLENOL EX: 325 MG TABLETS, 2 TABLETS EVERY 4-6 HOURS AS NEEDED DO NOT EXCEED 3000 MG IN 24 HOUR PERIOD

## 2021-12-28 ENCOUNTER — Observation Stay: Payer: Medicare Other

## 2021-12-28 ENCOUNTER — Other Ambulatory Visit: Payer: Self-pay

## 2021-12-28 ENCOUNTER — Ambulatory Visit: Payer: Medicare Other | Admitting: Urgent Care

## 2021-12-28 ENCOUNTER — Observation Stay
Admission: RE | Admit: 2021-12-28 | Discharge: 2021-12-29 | Disposition: A | Discharge status: Home Health | Payer: Medicare Other | Attending: Orthopedic Surgery | Admitting: Orthopedic Surgery

## 2021-12-28 ENCOUNTER — Encounter
Admission: RE | Disposition: A | Discharge status: Home Health | Payer: Self-pay | Source: Home / Self Care | Attending: Orthopedic Surgery

## 2021-12-28 ENCOUNTER — Encounter: Payer: Self-pay | Admitting: Orthopedic Surgery

## 2021-12-28 ENCOUNTER — Ambulatory Visit: Payer: Medicare Other

## 2021-12-28 DIAGNOSIS — J449 Chronic obstructive pulmonary disease, unspecified: Secondary | ICD-10-CM | POA: Insufficient documentation

## 2021-12-28 DIAGNOSIS — J45909 Unspecified asthma, uncomplicated: Secondary | ICD-10-CM | POA: Diagnosis not present

## 2021-12-28 DIAGNOSIS — I10 Essential (primary) hypertension: Secondary | ICD-10-CM | POA: Insufficient documentation

## 2021-12-28 DIAGNOSIS — Z79899 Other long term (current) drug therapy: Secondary | ICD-10-CM | POA: Diagnosis not present

## 2021-12-28 DIAGNOSIS — Z96642 Presence of left artificial hip joint: Secondary | ICD-10-CM

## 2021-12-28 DIAGNOSIS — Z85828 Personal history of other malignant neoplasm of skin: Secondary | ICD-10-CM | POA: Diagnosis not present

## 2021-12-28 DIAGNOSIS — E039 Hypothyroidism, unspecified: Secondary | ICD-10-CM | POA: Insufficient documentation

## 2021-12-28 DIAGNOSIS — M1612 Unilateral primary osteoarthritis, left hip: Principal | ICD-10-CM | POA: Insufficient documentation

## 2021-12-28 HISTORY — PX: TOTAL HIP ARTHROPLASTY: SHX124

## 2021-12-28 LAB — CBC
HCT: 38.9 % — ABNORMAL LOW (ref 39.0–52.0)
Hemoglobin: 13.1 g/dL (ref 13.0–17.0)
MCH: 29.8 pg (ref 26.0–34.0)
MCHC: 33.7 g/dL (ref 30.0–36.0)
MCV: 88.6 fL (ref 80.0–100.0)
Platelets: 247 10*3/uL (ref 150–400)
RBC: 4.39 MIL/uL (ref 4.22–5.81)
RDW: 13.6 % (ref 11.5–15.5)
WBC: 12.2 10*3/uL — ABNORMAL HIGH (ref 4.0–10.5)
nRBC: 0 % (ref 0.0–0.2)

## 2021-12-28 LAB — ABO/RH: ABO/RH(D): A POS

## 2021-12-28 LAB — CREATININE, SERUM
Creatinine, Ser: 0.96 mg/dL (ref 0.61–1.24)
GFR, Estimated: >60 mL/min (ref >60)

## 2021-12-28 SURGERY — ARTHROPLASTY, HIP, TOTAL, ANTERIOR APPROACH
Anesthesia: General | Site: Hip | Laterality: Left

## 2021-12-28 MED ORDER — ROCURONIUM BROMIDE 100 MG/10ML IV SOLN
INTRAVENOUS | Status: DC | PRN
  Administered 2021-12-28: 60 mg via INTRAVENOUS

## 2021-12-28 MED ORDER — BUPIVACAINE HCL (PF) 0.5 % IJ SOLN
INTRAMUSCULAR | Status: AC
  Filled 2021-12-28: qty 10

## 2021-12-28 MED ORDER — MOMETASONE FURO-FORMOTEROL FUM 200-5 MCG/ACT IN AERO
2.0000 | INHALATION_SPRAY | Freq: Two times a day (BID) | RESPIRATORY_TRACT | Status: DC
  Administered 2021-12-28 – 2021-12-29 (×2): 2 via RESPIRATORY_TRACT
  Filled 2021-12-28: qty 8.8

## 2021-12-28 MED ORDER — 0.9 % SODIUM CHLORIDE (POUR BTL) OPTIME
TOPICAL | Status: DC | PRN
  Administered 2021-12-28: 1000 mL

## 2021-12-28 MED ORDER — CEFAZOLIN SODIUM-DEXTROSE 2-4 GM/100ML-% IV SOLN
2.0000 g | Freq: Four times a day (QID) | INTRAVENOUS | Status: AC
  Administered 2021-12-28: 2 g via INTRAVENOUS
  Filled 2021-12-28 (×2): qty 100

## 2021-12-28 MED ORDER — ALBUTEROL SULFATE (2.5 MG/3ML) 0.083% IN NEBU
3.0000 mL | INHALATION_SOLUTION | Freq: Four times a day (QID) | RESPIRATORY_TRACT | Status: DC
Start: 2021-12-28 — End: 2021-12-28

## 2021-12-28 MED ORDER — MIDAZOLAM HCL 5 MG/5ML IJ SOLN
INTRAMUSCULAR | Status: DC | PRN
  Administered 2021-12-28: 2 mg via INTRAVENOUS

## 2021-12-28 MED ORDER — ONDANSETRON HCL 4 MG/2ML IJ SOLN
INTRAMUSCULAR | Status: DC | PRN
  Administered 2021-12-28: 4 mg via INTRAVENOUS

## 2021-12-28 MED ORDER — SODIUM CHLORIDE (PF) 0.9 % IJ SOLN
INTRAMUSCULAR | Status: DC | PRN
  Administered 2021-12-28: 90 mL

## 2021-12-28 MED ORDER — HYDROCHLOROTHIAZIDE 25 MG PO TABS
25.0000 mg | ORAL_TABLET | Freq: Every day | ORAL | Status: DC
  Administered 2021-12-28 – 2021-12-29 (×2): 25 mg via ORAL
  Filled 2021-12-28 (×2): qty 1

## 2021-12-28 MED ORDER — IRBESARTAN 150 MG PO TABS
300.0000 mg | ORAL_TABLET | Freq: Every day | ORAL | Status: DC
  Administered 2021-12-28 – 2021-12-29 (×2): 300 mg via ORAL
  Filled 2021-12-28 (×2): qty 2

## 2021-12-28 MED ORDER — LIDOCAINE HCL (CARDIAC) PF 100 MG/5ML IV SOSY
PREFILLED_SYRINGE | INTRAVENOUS | Status: DC | PRN
  Administered 2021-12-28: 100 mg via INTRAVENOUS

## 2021-12-28 MED ORDER — SEVOFLURANE IN SOLN
RESPIRATORY_TRACT | Status: AC
  Filled 2021-12-28: qty 250

## 2021-12-28 MED ORDER — KETAMINE HCL 10 MG/ML IJ SOLN
INTRAMUSCULAR | Status: DC | PRN
  Administered 2021-12-28: 10 mg via INTRAVENOUS
  Administered 2021-12-28: 40 mg via INTRAVENOUS

## 2021-12-28 MED ORDER — MAGNESIUM HYDROXIDE 400 MG/5ML PO SUSP
30.0000 mL | Freq: Every day | ORAL | Status: DC
  Administered 2021-12-28: 30 mL via ORAL
  Filled 2021-12-28: qty 30

## 2021-12-28 MED ORDER — HYDROCODONE-ACETAMINOPHEN 5-325 MG PO TABS
1.0000 | ORAL_TABLET | ORAL | Status: DC | PRN
  Filled 2021-12-28: qty 2

## 2021-12-28 MED ORDER — QUETIAPINE FUMARATE 25 MG PO TABS
50.0000 mg | ORAL_TABLET | Freq: Every day | ORAL | Status: DC
  Administered 2021-12-28: 50 mg via ORAL
  Filled 2021-12-28: qty 2

## 2021-12-28 MED ORDER — DEXMEDETOMIDINE HCL IN NACL 200 MCG/50ML IV SOLN
INTRAVENOUS | Status: DC | PRN
  Administered 2021-12-28: 8 ug via INTRAVENOUS
  Administered 2021-12-28: 12 ug via INTRAVENOUS

## 2021-12-28 MED ORDER — FAMOTIDINE 20 MG PO TABS: 20.0000 mg | ORAL_TABLET | Freq: Once | ORAL | Status: AC

## 2021-12-28 MED ORDER — SODIUM CHLORIDE (PF) 0.9 % IJ SOLN
INTRAMUSCULAR | Status: AC
  Filled 2021-12-28: qty 50

## 2021-12-28 MED ORDER — ONDANSETRON HCL 4 MG PO TABS: 4.0000 mg | ORAL_TABLET | Freq: Four times a day (QID) | ORAL | Status: DC | PRN

## 2021-12-28 MED ORDER — ONDANSETRON HCL 4 MG/2ML IJ SOLN
4.0000 mg | Freq: Once | INTRAMUSCULAR | Status: DC | PRN
Start: 2021-12-28 — End: 2021-12-28

## 2021-12-28 MED ORDER — VITAMIN B-12 100 MCG PO TABS
100.0000 ug | ORAL_TABLET | Freq: Two times a day (BID) | ORAL | Status: DC
  Administered 2021-12-28 – 2021-12-29 (×3): 100 ug via ORAL
  Filled 2021-12-28 (×3): qty 1

## 2021-12-28 MED ORDER — MIDAZOLAM HCL 2 MG/2ML IJ SOLN
INTRAMUSCULAR | Status: AC
  Filled 2021-12-28: qty 2

## 2021-12-28 MED ORDER — METOCLOPRAMIDE HCL 5 MG/ML IJ SOLN: 5.0000 mg | Freq: Three times a day (TID) | INTRAMUSCULAR | Status: DC | PRN

## 2021-12-28 MED ORDER — PHENYLEPHRINE HCL-NACL 20-0.9 MG/250ML-% IV SOLN
INTRAVENOUS | Status: DC | PRN
  Administered 2021-12-28: 30 ug/min via INTRAVENOUS

## 2021-12-28 MED ORDER — MORPHINE SULFATE (PF) 2 MG/ML IV SOLN: 0.5000 mg | INTRAVENOUS | Status: DC | PRN

## 2021-12-28 MED ORDER — LORATADINE 10 MG PO TABS
10.0000 mg | ORAL_TABLET | Freq: Every day | ORAL | Status: DC
  Administered 2021-12-28 – 2021-12-29 (×2): 10 mg via ORAL
  Filled 2021-12-28 (×2): qty 1

## 2021-12-28 MED ORDER — METOCLOPRAMIDE HCL 5 MG PO TABS: 5.0000 mg | ORAL_TABLET | Freq: Three times a day (TID) | ORAL | Status: DC | PRN

## 2021-12-28 MED ORDER — CEFAZOLIN SODIUM-DEXTROSE 2-4 GM/100ML-% IV SOLN
INTRAVENOUS | Status: AC
  Administered 2021-12-28: 2 g via INTRAVENOUS
  Filled 2021-12-28: qty 100

## 2021-12-28 MED ORDER — OXYCODONE HCL 5 MG/5ML PO SOLN: 5.0000 mg | Freq: Once | ORAL | Status: DC | PRN

## 2021-12-28 MED ORDER — BUPIVACAINE LIPOSOME 1.3 % IJ SUSP
INTRAMUSCULAR | Status: AC
  Filled 2021-12-28: qty 20

## 2021-12-28 MED ORDER — DEXAMETHASONE SODIUM PHOSPHATE 10 MG/ML IJ SOLN
INTRAMUSCULAR | Status: DC | PRN
  Administered 2021-12-28: 10 mg via INTRAVENOUS

## 2021-12-28 MED ORDER — COENZYME Q-10 100 MG PO CAPS: 100.0000 mg | ORAL_CAPSULE | Freq: Every day | ORAL | Status: DC

## 2021-12-28 MED ORDER — ALBUTEROL SULFATE (2.5 MG/3ML) 0.083% IN NEBU
3.0000 mL | INHALATION_SOLUTION | Freq: Three times a day (TID) | RESPIRATORY_TRACT | Status: DC
  Administered 2021-12-29: 3 mL via RESPIRATORY_TRACT
  Filled 2021-12-28: qty 3

## 2021-12-28 MED ORDER — SUCCINYLCHOLINE CHLORIDE 200 MG/10ML IV SOSY
PREFILLED_SYRINGE | INTRAVENOUS | Status: DC | PRN
  Administered 2021-12-28: 120 mg via INTRAVENOUS

## 2021-12-28 MED ORDER — PANTOPRAZOLE SODIUM 40 MG PO TBEC
40.0000 mg | DELAYED_RELEASE_TABLET | Freq: Every day | ORAL | Status: DC
  Administered 2021-12-28 – 2021-12-29 (×2): 40 mg via ORAL
  Filled 2021-12-28 (×2): qty 1

## 2021-12-28 MED ORDER — PRAVASTATIN SODIUM 20 MG PO TABS
20.0000 mg | ORAL_TABLET | Freq: Every day | ORAL | Status: DC
  Administered 2021-12-28: 20 mg via ORAL
  Filled 2021-12-28: qty 1

## 2021-12-28 MED ORDER — HYDROMORPHONE HCL 1 MG/ML IJ SOLN
INTRAMUSCULAR | Status: DC | PRN
  Administered 2021-12-28 (×2): .5 mg via INTRAVENOUS

## 2021-12-28 MED ORDER — FAMOTIDINE 20 MG PO TABS
ORAL_TABLET | ORAL | Status: AC
  Administered 2021-12-28: 20 mg via ORAL
  Filled 2021-12-28: qty 1

## 2021-12-28 MED ORDER — DOCUSATE SODIUM 100 MG PO CAPS
100.0000 mg | ORAL_CAPSULE | Freq: Two times a day (BID) | ORAL | Status: DC
  Administered 2021-12-28 – 2021-12-29 (×3): 100 mg via ORAL
  Filled 2021-12-28 (×3): qty 1

## 2021-12-28 MED ORDER — FLUTICASONE PROPIONATE 50 MCG/ACT NA SUSP
2.0000 | Freq: Every day | NASAL | Status: DC | PRN
Start: 2021-12-28 — End: 2021-12-29

## 2021-12-28 MED ORDER — FENTANYL CITRATE (PF) 100 MCG/2ML IJ SOLN
INTRAMUSCULAR | Status: DC | PRN
  Administered 2021-12-28: 100 ug via INTRAVENOUS

## 2021-12-28 MED ORDER — DIPHENHYDRAMINE HCL 12.5 MG/5ML PO ELIX: 12.5000 mg | ORAL_SOLUTION | ORAL | Status: DC | PRN

## 2021-12-28 MED ORDER — HYDROCODONE-ACETAMINOPHEN 7.5-325 MG PO TABS
ORAL_TABLET | ORAL | Status: AC
  Filled 2021-12-28: qty 2

## 2021-12-28 MED ORDER — FLEET ENEMA 7-19 GM/118ML RE ENEM: 1.0000 | ENEMA | Freq: Once | RECTAL | Status: DC | PRN

## 2021-12-28 MED ORDER — ACETAMINOPHEN 10 MG/ML IV SOLN
INTRAVENOUS | Status: DC | PRN
  Administered 2021-12-28: 1000 mg via INTRAVENOUS

## 2021-12-28 MED ORDER — PHENOL 1.4 % MT LIQD: 1.0000 | OROMUCOSAL | Status: DC | PRN

## 2021-12-28 MED ORDER — ZOLPIDEM TARTRATE 5 MG PO TABS: 5.0000 mg | ORAL_TABLET | Freq: Every evening | ORAL | Status: DC | PRN

## 2021-12-28 MED ORDER — DEXMEDETOMIDINE HCL IN NACL 80 MCG/20ML IV SOLN
INTRAVENOUS | Status: AC
  Filled 2021-12-28: qty 20

## 2021-12-28 MED ORDER — SUGAMMADEX SODIUM 200 MG/2ML IV SOLN
INTRAVENOUS | Status: DC | PRN
  Administered 2021-12-28: 400 mg via INTRAVENOUS

## 2021-12-28 MED ORDER — SURGIPHOR WOUND IRRIGATION SYSTEM - OPTIME
TOPICAL | Status: DC | PRN
  Administered 2021-12-28: 450 mL via TOPICAL

## 2021-12-28 MED ORDER — NORTRIPTYLINE HCL 25 MG PO CAPS
50.0000 mg | ORAL_CAPSULE | Freq: Every day | ORAL | Status: DC
Start: 2021-12-28 — End: 2021-12-29
  Administered 2021-12-28: 50 mg via ORAL
  Filled 2021-12-28: qty 2

## 2021-12-28 MED ORDER — OXYCODONE HCL 5 MG PO TABS: 5.0000 mg | ORAL_TABLET | Freq: Once | ORAL | Status: DC | PRN

## 2021-12-28 MED ORDER — ACETAMINOPHEN 10 MG/ML IV SOLN
INTRAVENOUS | Status: AC
  Filled 2021-12-28: qty 100

## 2021-12-28 MED ORDER — ENOXAPARIN SODIUM 40 MG/0.4ML IJ SOSY
40.0000 mg | PREFILLED_SYRINGE | INTRAMUSCULAR | Status: DC
Start: 2021-12-29 — End: 2021-12-29
  Administered 2021-12-29: 40 mg via SUBCUTANEOUS
  Filled 2021-12-28: qty 0.4

## 2021-12-28 MED ORDER — FENTANYL CITRATE (PF) 100 MCG/2ML IJ SOLN
25.0000 ug | INTRAMUSCULAR | Status: DC | PRN
  Administered 2021-12-28: 50 ug via INTRAVENOUS

## 2021-12-28 MED ORDER — HYDROCODONE-ACETAMINOPHEN 7.5-325 MG PO TABS
1.0000 | ORAL_TABLET | ORAL | Status: DC | PRN
  Administered 2021-12-28: 2 via ORAL

## 2021-12-28 MED ORDER — AMLODIPINE BESYLATE 5 MG PO TABS
5.0000 mg | ORAL_TABLET | Freq: Every day | ORAL | Status: DC
  Administered 2021-12-28 – 2021-12-29 (×2): 5 mg via ORAL
  Filled 2021-12-28 (×2): qty 1

## 2021-12-28 MED ORDER — CHLORHEXIDINE GLUCONATE 0.12 % MT SOLN: 15.0000 mL | Freq: Once | OROMUCOSAL | Status: AC

## 2021-12-28 MED ORDER — LEVOTHYROXINE SODIUM 125 MCG PO TABS
125.0000 ug | ORAL_TABLET | Freq: Every day | ORAL | Status: DC
  Administered 2021-12-29: 125 ug via ORAL
  Filled 2021-12-28: qty 1

## 2021-12-28 MED ORDER — TIZANIDINE HCL 4 MG PO TABS
4.0000 mg | ORAL_TABLET | Freq: Three times a day (TID) | ORAL | Status: DC | PRN
Start: 2021-12-28 — End: 2021-12-29

## 2021-12-28 MED ORDER — LACTATED RINGERS IV SOLN: INTRAVENOUS | Status: DC | PRN

## 2021-12-28 MED ORDER — MENTHOL 3 MG MT LOZG: 1.0000 | LOZENGE | OROMUCOSAL | Status: DC | PRN

## 2021-12-28 MED ORDER — ORAL CARE MOUTH RINSE: 15.0000 mL | Freq: Once | OROMUCOSAL | Status: AC

## 2021-12-28 MED ORDER — ALUM & MAG HYDROXIDE-SIMETH 200-200-20 MG/5ML PO SUSP: 30.0000 mL | ORAL | Status: DC | PRN

## 2021-12-28 MED ORDER — LIDOCAINE HCL (PF) 2 % IJ SOLN
INTRAMUSCULAR | Status: AC
  Filled 2021-12-28: qty 5

## 2021-12-28 MED ORDER — HEMOSTATIC AGENTS (NO CHARGE) OPTIME
TOPICAL | Status: DC | PRN
  Administered 2021-12-28: 2 via TOPICAL

## 2021-12-28 MED ORDER — BUSPIRONE HCL 10 MG PO TABS
30.0000 mg | ORAL_TABLET | Freq: Two times a day (BID) | ORAL | Status: DC
  Administered 2021-12-28 – 2021-12-29 (×3): 30 mg via ORAL
  Filled 2021-12-28 (×3): qty 3

## 2021-12-28 MED ORDER — CHLORHEXIDINE GLUCONATE 0.12 % MT SOLN
OROMUCOSAL | Status: AC
  Administered 2021-12-28: 15 mL via OROMUCOSAL
  Filled 2021-12-28: qty 15

## 2021-12-28 MED ORDER — ACETAMINOPHEN 325 MG PO TABS: 325.0000 mg | ORAL_TABLET | Freq: Four times a day (QID) | ORAL | Status: DC | PRN

## 2021-12-28 MED ORDER — POLYVINYL ALCOHOL 1.4 % OP SOLN
1.0000 [drp] | Freq: Two times a day (BID) | OPHTHALMIC | Status: DC | PRN
Start: 2021-12-28 — End: 2021-12-29

## 2021-12-28 MED ORDER — CEFAZOLIN SODIUM-DEXTROSE 2-4 GM/100ML-% IV SOLN
2.0000 g | INTRAVENOUS | Status: AC
  Administered 2021-12-28: 2 g via INTRAVENOUS

## 2021-12-28 MED ORDER — ALBUTEROL SULFATE (2.5 MG/3ML) 0.083% IN NEBU
3.0000 mL | INHALATION_SOLUTION | Freq: Four times a day (QID) | RESPIRATORY_TRACT | Status: DC
  Administered 2021-12-28: 3 mL via RESPIRATORY_TRACT
  Filled 2021-12-28: qty 3

## 2021-12-28 MED ORDER — TRAMADOL HCL 50 MG PO TABS
50.0000 mg | ORAL_TABLET | Freq: Four times a day (QID) | ORAL | Status: DC
  Administered 2021-12-28 – 2021-12-29 (×5): 50 mg via ORAL
  Filled 2021-12-28 (×6): qty 1

## 2021-12-28 MED ORDER — ONDANSETRON HCL 4 MG/2ML IJ SOLN: 4.0000 mg | Freq: Four times a day (QID) | INTRAMUSCULAR | Status: DC | PRN

## 2021-12-28 MED ORDER — BISACODYL 5 MG PO TBEC: 5.0000 mg | DELAYED_RELEASE_TABLET | Freq: Every day | ORAL | Status: DC | PRN

## 2021-12-28 MED ORDER — SODIUM CHLORIDE 0.9 % IV SOLN: INTRAVENOUS | Status: DC

## 2021-12-28 MED ORDER — FENTANYL CITRATE (PF) 100 MCG/2ML IJ SOLN
INTRAMUSCULAR | Status: AC
  Filled 2021-12-28: qty 2

## 2021-12-28 MED ORDER — PROPOFOL 10 MG/ML IV BOLUS
INTRAVENOUS | Status: DC | PRN
  Administered 2021-12-28: 200 mg via INTRAVENOUS

## 2021-12-28 MED ORDER — KETAMINE HCL 50 MG/5ML IJ SOSY
PREFILLED_SYRINGE | INTRAMUSCULAR | Status: AC
  Filled 2021-12-28: qty 5

## 2021-12-28 MED ORDER — SAW PALMETTO (SERENOA REPENS) 160 MG PO CAPS: 160.0000 mg | ORAL_CAPSULE | Freq: Every day | ORAL | Status: DC

## 2021-12-28 MED ORDER — LACTATED RINGERS IV SOLN: INTRAVENOUS | Status: DC

## 2021-12-28 MED ORDER — HYDROMORPHONE HCL 1 MG/ML IJ SOLN
INTRAMUSCULAR | Status: AC
  Filled 2021-12-28: qty 1

## 2021-12-28 MED ORDER — ACETAMINOPHEN 10 MG/ML IV SOLN: 1000.0000 mg | Freq: Once | INTRAVENOUS | Status: DC | PRN

## 2021-12-28 MED ORDER — SENNOSIDES-DOCUSATE SODIUM 8.6-50 MG PO TABS: 1.0000 | ORAL_TABLET | Freq: Every evening | ORAL | Status: DC | PRN

## 2021-12-28 MED ORDER — BUPIVACAINE-EPINEPHRINE (PF) 0.25% -1:200000 IJ SOLN
INTRAMUSCULAR | Status: AC
  Filled 2021-12-28: qty 60

## 2021-12-28 MED ORDER — FENTANYL CITRATE (PF) 100 MCG/2ML IJ SOLN
INTRAMUSCULAR | Status: AC
  Administered 2021-12-28: 50 ug via INTRAVENOUS
  Filled 2021-12-28: qty 2

## 2021-12-28 SURGICAL SUPPLY — 56 items
BLADE SAGITTAL AGGR TOOTH XLG (BLADE) ×2 IMPLANT
BNDG COHESIVE 6X5 TAN ST LF (GAUZE/BANDAGES/DRESSINGS) ×6 IMPLANT
CANISTER WOUND CARE 500ML ATS (WOUND CARE) ×2 IMPLANT
CHLORAPREP W/TINT 26 (MISCELLANEOUS) ×2 IMPLANT
COVER BACK TABLE REUSABLE LG (DRAPES) ×2 IMPLANT
DRAPE 3/4 80X56 (DRAPES) ×6 IMPLANT
DRAPE C-ARM XRAY 36X54 (DRAPES) ×2 IMPLANT
DRAPE INCISE IOBAN 66X60 STRL (DRAPES) IMPLANT
DRAPE POUCH INSTRU U-SHP 10X18 (DRAPES) ×2 IMPLANT
DRESSING SURGICEL FIBRLLR 1X2 (HEMOSTASIS) ×2 IMPLANT
DRSG MEPILEX SACRM 8.7X9.8 (GAUZE/BANDAGES/DRESSINGS) ×2 IMPLANT
DRSG SURGICEL FIBRILLAR 1X2 (HEMOSTASIS) ×4
ELECT BLADE 6.5 EXT (BLADE) ×2 IMPLANT
ELECT REM PT RETURN 9FT ADLT (ELECTROSURGICAL) ×2
ELECTRODE REM PT RTRN 9FT ADLT (ELECTROSURGICAL) ×1 IMPLANT
GLOVE BIOGEL PI IND STRL 9 (GLOVE) ×1 IMPLANT
GLOVE BIOGEL PI INDICATOR 9 (GLOVE) ×1
GLOVE SURG SYN 9.0  PF PI (GLOVE) ×2
GLOVE SURG SYN 9.0 PF PI (GLOVE) ×2 IMPLANT
GOWN SRG 2XL LVL 4 RGLN SLV (GOWNS) ×1 IMPLANT
GOWN STRL NON-REIN 2XL LVL4 (GOWNS) ×1
GOWN STRL REUS W/ TWL LRG LVL3 (GOWN DISPOSABLE) ×1 IMPLANT
GOWN STRL REUS W/TWL LRG LVL3 (GOWN DISPOSABLE) ×1
HIP FEM HD S 28 (Head) ×1 IMPLANT
HOLDER FOLEY CATH W/STRAP (MISCELLANEOUS) ×2 IMPLANT
HOOD PEEL AWAY FLYTE STAYCOOL (MISCELLANEOUS) ×2 IMPLANT
KIT PREVENA INCISION MGT 13 (CANNISTER) ×2 IMPLANT
LINER DM 28MM (Liner) ×2 IMPLANT
LINER DM SZH 28X56 (Liner) IMPLANT
MANIFOLD NEPTUNE II (INSTRUMENTS) ×2 IMPLANT
MASTERLOC HIP LATERAL S6 (Hips) ×1 IMPLANT
MAT ABSORB  FLUID 56X50 GRAY (MISCELLANEOUS) ×1
MAT ABSORB FLUID 56X50 GRAY (MISCELLANEOUS) ×1 IMPLANT
NDL SPNL 20GX3.5 QUINCKE YW (NEEDLE) ×2 IMPLANT
NEEDLE SPNL 20GX3.5 QUINCKE YW (NEEDLE) ×4 IMPLANT
NS IRRIG 1000ML POUR BTL (IV SOLUTION) ×2 IMPLANT
PACK HIP COMPR (MISCELLANEOUS) ×2 IMPLANT
SCALPEL PROTECTED #10 DISP (BLADE) ×4 IMPLANT
SHELL ACETABULAR DM  56MM (Shell) ×1 IMPLANT
SOLUTION IRRIG SURGIPHOR (IV SOLUTION) ×2 IMPLANT
STAPLER SKIN PROX 35W (STAPLE) ×2 IMPLANT
STRAP SAFETY 5IN WIDE (MISCELLANEOUS) ×2 IMPLANT
SUT DVC 2 QUILL PDO  T11 36X36 (SUTURE) ×1
SUT DVC 2 QUILL PDO T11 36X36 (SUTURE) ×1 IMPLANT
SUT DVC VLOC 90 3-0 CV23 UNDY (SUTURE) ×1 IMPLANT
SUT SILK 0 (SUTURE) ×1
SUT SILK 0 30XBRD TIE 6 (SUTURE) ×1 IMPLANT
SUT V-LOC 90 ABS DVC 3-0 CL (SUTURE) ×1 IMPLANT
SUT VIC AB 1 CT1 36 (SUTURE) ×2 IMPLANT
SYR 50ML LL SCALE MARK (SYRINGE) ×4 IMPLANT
SYR BULB IRRIG 60ML STRL (SYRINGE) ×2 IMPLANT
TAPE MICROFOAM 4IN (TAPE) IMPLANT
TOWEL OR 17X26 4PK STRL BLUE (TOWEL DISPOSABLE) IMPLANT
TRAY FOLEY MTR SLVR 16FR STAT (SET/KITS/TRAYS/PACK) ×1 IMPLANT
WAND WEREWOLF FASTSEAL 6.0 (MISCELLANEOUS) ×1 IMPLANT
WATER STERILE IRR 1000ML POUR (IV SOLUTION) ×2 IMPLANT

## 2021-12-28 NOTE — Plan of Care (Signed)
  Problem: Coping: Goal: Level of anxiety will decrease 12/28/2021 1614 by Derrek Monaco, RN Outcome: Progressing 12/28/2021 1612 by Derrek Monaco, RN Outcome: Progressing   Problem: Elimination: Goal: Will not experience complications related to bowel motility 12/28/2021 1614 by Derrek Monaco, RN Outcome: Progressing 12/28/2021 1612 by Derrek Monaco, RN Outcome: Progressing Goal: Will not experience complications related to urinary retention 12/28/2021 1614 by Derrek Monaco, RN Outcome: Progressing 12/28/2021 1612 by Derrek Monaco, RN Outcome: Progressing   Problem: Pain Managment: Goal: General experience of comfort will improve 12/28/2021 1614 by Derrek Monaco, RN Outcome: Progressing 12/28/2021 1612 by Derrek Monaco, RN Outcome: Progressing   Problem: Safety: Goal: Ability to remain free from injury will improve 12/28/2021 1614 by Derrek Monaco, RN Outcome: Progressing 12/28/2021 1612 by Derrek Monaco, RN Outcome: Progressing   Problem: Skin Integrity: Goal: Risk for impaired skin integrity will decrease 12/28/2021 1614 by Derrek Monaco, RN Outcome: Progressing 12/28/2021 1612 by Derrek Monaco, RN Outcome: Progressing   Problem: Education: Goal: Knowledge of the prescribed therapeutic regimen will improve Outcome: Progressing   Problem: Activity: Goal: Ability to avoid complications of mobility impairment will improve Outcome: Progressing Goal: Ability to tolerate increased activity will improve Outcome: Progressing

## 2021-12-28 NOTE — Anesthesia Postprocedure Evaluation (Signed)
Anesthesia Post Note  Patient: Frank Golden  Procedure(s) Performed: TOTAL HIP ARTHROPLASTY ANTERIOR APPROACH (Left: Hip)  Patient location during evaluation: PACU Anesthesia Type: General Level of consciousness: awake and alert Pain management: pain level controlled Vital Signs Assessment: post-procedure vital signs reviewed and stable Respiratory status: spontaneous breathing, nonlabored ventilation, respiratory function stable and patient connected to nasal cannula oxygen Cardiovascular status: blood pressure returned to baseline and stable Postop Assessment: no apparent nausea or vomiting Anesthetic complications: no   No notable events documented.   Last Vitals:  Vitals:   12/28/21 1340 12/28/21 1404  BP: (!) 136/95 (!) 136/91  Pulse: 87 85  Resp: 17 16  Temp: (!) 36.2 C (!) 36.2 C  SpO2: 96% 98%    Last Pain:  Vitals:   12/28/21 1402  TempSrc:   PainSc: 7                  Arita Miss

## 2021-12-28 NOTE — Op Note (Signed)
12/28/2021  12:59 PM  PATIENT:  Frank Golden  66 y.o. male  PRE-OPERATIVE DIAGNOSIS:  Primary localized osteoarthritis of left hip  M16.12 Acute pain of left hip  M25.552  POST-OPERATIVE DIAGNOSIS:  Primary localized osteoarthritis of left hip  M16.12 Acute pain of left hip  M25.552  PROCEDURE:  Procedure(s): TOTAL HIP ARTHROPLASTY ANTERIOR APPROACH (Left)  SURGEON: Laurene Footman, MD  ASSISTANTS: None  ANESTHESIA:   general  EBL:  Total I/O In: 100 [IV Piggyback:100] Out: -   BLOOD ADMINISTERED:none  DRAINS:  Incisional wound VAC    LOCAL MEDICATIONS USED:  MARCAINE    and OTHER Exparel  SPECIMEN:  Source of Specimen:    Left femoral head  DISPOSITION OF SPECIMEN:  PATHOLOGY  COUNTS:  YES  TOURNIQUET:  * No tourniquets in log *  IMPLANTS: Medacta Masterloc 6 LAT stem with 56 mm Mpact DM cup and liner with metal S 28 mm head  DICTATION: .Dragon Dictation   The patient was brought to the operating room and after spinal anesthesia was obtained patient was placed on the operative table with the ipsilateral foot into the Medacta attachment, contralateral leg on a well-padded table. C-arm was brought in and preop template x-ray taken. After prepping and draping in usual sterile fashion appropriate patient identification and timeout procedures were completed. Anterior approach to the hip was obtained and centered over the greater trochanter and TFL muscle. The subcutaneous tissue was incised hemostasis being achieved by electrocautery. TFL fascia was incised and the muscle retracted laterally deep retractor placed. The lateral femoral circumflex vessels were identified and ligated. The anterior capsule was exposed and a capsulotomy performed. The neck was identified and a femoral neck cut carried out with a saw. The head was removed without difficulty and showed sclerotic femoral head and acetabulum. Reaming was carried out to 56 mm and a 56 mm cup trial gave appropriate tightness  to the acetabular component a 56 DM cup was impacted into position. The leg was then externally rotated and ischiofemoral and pubofemoral releases carried out. The femur was sequentially broached to a size 6, size 6 LAT with S head trials were placed and the final components chosen. The 6 LAT stem was inserted along with a metal S 28 mm head and 56 mm liner. The hip was reduced and was stable the wound was thoroughly irrigated with fibrillar placed along the posterior capsule and medial neck. The deep fascia ws closed using a heavy Quill after infiltration of 30 cc of quarter percent Sensorcaine with epinephrine diluted with Exparel throughout the case .3-0 V-loc to close the skin with skin staples.  Incisional wound VAC applied and patient was sent to recovery in stable condition.   PLAN OF CARE: Admit for overnight observation

## 2021-12-28 NOTE — Plan of Care (Signed)

## 2021-12-28 NOTE — Anesthesia Preprocedure Evaluation (Signed)
Anesthesia Evaluation  Patient identified by MRN, date of birth, ID band Patient awake    Reviewed: Allergy & Precautions, NPO status , Patient's Chart, lab work & pertinent test results  History of Anesthesia Complications Negative for: history of anesthetic complications  Airway Mallampati: II  TM Distance: >3 FB Neck ROM: Full    Dental no notable dental hx. (+) Teeth Intact   Pulmonary asthma , sleep apnea and Continuous Positive Airway Pressure Ventilation , neg COPD, Patient abstained from smoking.Not current smoker, former smoker,    Pulmonary exam normal breath sounds clear to auscultation       Cardiovascular Exercise Tolerance: Good METShypertension, Pt. on medications (-) CAD and (-) Past MI (-) dysrhythmias  Rhythm:Regular Rate:Normal - Systolic murmurs    Neuro/Psych PSYCHIATRIC DISORDERS Anxiety Depression negative neurological ROS     GI/Hepatic neg GERD  ,(+)     (-) substance abuse  ,   Endo/Other  neg diabetesHypothyroidism   Renal/GU negative Renal ROS     Musculoskeletal  (+) Arthritis ,   Abdominal   Peds  Hematology   Anesthesia Other Findings Past Medical History: No date: Allergic state No date: Asthma No date: Cancer [facility])     Comment:  skin cancer on face No date: Depression No date: DJD (degenerative joint disease) No date: ED (erectile dysfunction) No date: History of chicken pox No date: History of pancreatitis No date: Hyperlipidemia No date: Hypertension No date: Hypertension No date: Hypothyroid No date: OCD (obsessive compulsive disorder) No date: Psoriasis No date: Rheumatoid arthritis (HCC) No date: Sleep apnea     Comment:  uses CPAP  Reproductive/Obstetrics                             Anesthesia Physical Anesthesia Plan  ASA: 2  Anesthesia Plan: Spinal   Post-op Pain Management: Ofirmev IV (intra-op)*   Induction:  Intravenous  PONV Risk Score and Plan: 1 and Ondansetron, Dexamethasone, Propofol infusion, TIVA and Midazolam  Airway Management Planned: Natural Airway and Nasal CPAP  Additional Equipment: None  Intra-op Plan:   Post-operative Plan:   Informed Consent: I have reviewed the patients History and Physical, chart, labs and discussed the procedure including the risks, benefits and alternatives for the proposed anesthesia with the patient or authorized representative who has indicated his/her understanding and acceptance.       Plan Discussed with: CRNA and Surgeon  Anesthesia Plan Comments: (Discussed R/B/A of neuraxial anesthesia technique with patient: - rare risks of spinal/epidural hematoma, nerve damage, infection - Risk of PDPH - Risk of nausea and vomiting - Risk of conversion to general anesthesia and its associated risks, including sore throat, damage to lips/eyes/teeth/oropharynx, and rare risks such as cardiac and respiratory events. - Risk of allergic reactions  Discussed the role of CRNA in patient's perioperative care.  Patient voiced understanding.)        Anesthesia Quick Evaluation

## 2021-12-28 NOTE — Transfer of Care (Signed)
Immediate Anesthesia Transfer of Care Note  Patient: Frank Golden  Procedure(s) Performed: TOTAL HIP ARTHROPLASTY ANTERIOR APPROACH (Left: Hip)  Patient Location: PACU  Anesthesia Type:General  Level of Consciousness: drowsy  Airway & Oxygen Therapy: Patient Spontanous Breathing and Patient connected to face mask  Post-op Assessment: Report given to RN and Post -op Vital signs reviewed and stable  Post vital signs: Reviewed and stable  Last Vitals:  Vitals Value Taken Time  BP 134/91 12/28/21 1300  Temp 36.5 C 12/28/21 1300  Pulse 92 12/28/21 1304  Resp 28 12/28/21 1304  SpO2 98 % 12/28/21 1304  Vitals shown include unvalidated device data.  Last Pain:  Vitals:   12/28/21 0926  TempSrc: Temporal  PainSc: 0-No pain         Complications: No notable events documented.

## 2021-12-28 NOTE — TOC Initial Note (Signed)
Transition of Care [facility]) - Initial/Assessment Note    Patient Details  Name: Frank Golden MRN: [id number] Date of Birth: [birth year]  [facility]) CM/SW Contact:    Conception Oms, RN Phone Number: 12/28/2021, 1:44 PM  Clinical Narrative:                 [facility] accepted for [facility] services,        Patient Goals and CMS Choice        Expected Discharge Plan and Services                                                Prior Living Arrangements/Services                       Activities of Daily Living Home Assistive Devices/Equipment: Eyeglasses, CPAP ADL Screening (condition at time of admission) Patient's cognitive ability adequate to safely complete daily activities?: Yes Is the patient deaf or have difficulty hearing?: No Does the patient have difficulty seeing, even when wearing glasses/contacts?: No Does the patient have difficulty concentrating, remembering, or making decisions?: No Patient able to express need for assistance with ADLs?: Yes Does the patient have difficulty dressing or bathing?: No Independently performs ADLs?: Yes (appropriate for developmental age) Does the patient have difficulty walking or climbing stairs?: Yes Weakness of Legs: Left Weakness of Arms/Hands: None  Permission Sought/Granted                  Emotional Assessment              Admission diagnosis:  Status post total hip replacement, left [W38.937] Patient Active Problem List   Diagnosis Date Noted   Status post total hip replacement, left 12/28/2021   Major depressive disorder, recurrent episode with anxious distress ([place]) 12/17/2018   Insomnia due to mental disorder 12/17/2018   Hyperlipidemia 07/26/2018   Depression 07/26/2018   Allergy 07/26/2018   Psoriasis 03/08/2018   Polyarthralgia 03/08/2018   Chronic insomnia 10/03/2016   GAD (generalized anxiety disorder) 10/03/2016   MCI (mild cognitive impairment) with memory loss  10/03/2016   Pituitary lesion ([place]) 10/03/2016   Hypothyroidism due to acquired atrophy of thyroid 11/23/2015   Essential hypertension 11/23/2015   Primary osteoarthritis involving multiple joints 11/23/2015   COPD (chronic obstructive pulmonary disease) ([place]) 09/25/2014   OSA on CPAP 09/25/2014   Obesity 09/25/2014   PCP:  Adin Hector, MD Pharmacy:   [facility] [id number], [place] [address] Phone: [phone] Fax: [phone]     Social Determinants of Health (SDOH) Interventions    Readmission Risk Interventions     No data to display

## 2021-12-28 NOTE — Evaluation (Signed)
Physical Therapy Evaluation Patient Details Name: Frank Golden MRN: [id number] DOB: 1955/10/24 Today's Date: 12/28/2021  History of Present Illness  Pt is a 66 yo M s/p L THA 12/28/21. PMH includes asthma, HTN, HLD, RA, arthritis, hypothyroidism.  Clinical Impression  Pt was pleasant and motivated to participate during the session and put forth good effort throughout. Pt able to complete supine<>sit w/ CGA with extra time and effort; min cuing needed for hand placement. Pt complete sit to stand w/ CGA but effortful and hesitant to weightbear through LLE; min cuing for hand placement when initiating stand. Pt able to take steps forwards/backwards and sidestep using RW w/ CGA following cuing for step to pattern and was steady w/ no LOB; initially very hesitant with LLE weightbearing but increased confidence as session progressed. SpO2 and HR WNL on room air with no other adverse symptoms reported/observed. Pt will benefit from HHPT upon discharge to safely address deficits listed in patient problem list for decreased caregiver assistance and eventual return to PLOF.         Recommendations for follow up therapy are one component of a multi-disciplinary discharge planning process, led by the attending physician.  Recommendations may be updated based on patient status, additional functional criteria and insurance authorization.  Follow Up Recommendations Home health PT      Assistance Recommended at Discharge Intermittent Supervision/Assistance  Patient can return home with the following  A little help with walking and/or transfers;A little help with bathing/dressing/bathroom;Assistance with cooking/housework;Assist for transportation;Help with stairs or ramp for entrance    Equipment Recommendations BSC/3in1  Recommendations for Other Services       Functional Status Assessment       Precautions / Restrictions Precautions Precautions: Fall;Anterior Hip Precaution Booklet Issued: Yes  (comment) Restrictions Weight Bearing Restrictions: Yes LLE Weight Bearing: Weight bearing as tolerated      Mobility  Bed Mobility Overal bed mobility: Needs Assistance Bed Mobility: Supine to Sit     Supine to sit: Min guard     General bed mobility comments: extra time and effort to complete; cuing for hand placement    Transfers Overall transfer level: Needs assistance Equipment used: Rolling walker (2 wheels) Transfers: Sit to/from Stand Sit to Stand: Min guard           General transfer comment: cuing for hand placement when initiating stand    Ambulation/Gait Ambulation/Gait assistance: Min guard Gait Distance (Feet): 5 Feet Assistive device: Rolling walker (2 wheels) Gait Pattern/deviations: Step-to pattern, Decreased step length - right, Decreased step length - left, Decreased stance time - left Gait velocity: decreased     General Gait Details: able to take steps forwards/backwards and sidestep w/ no LOB; a little hesitant when wbing on LLE  Stairs            Wheelchair Mobility    Modified Rankin (Stroke Patients Only)       Balance Overall balance assessment: Needs assistance Sitting-balance support: Bilateral upper extremity supported, Feet supported Sitting balance-Leahy Scale: Good     Standing balance support: Bilateral upper extremity supported, During functional activity Standing balance-Leahy Scale: Fair                               Pertinent Vitals/Pain Pain Assessment Pain Assessment: 0-10 Pain Score: 4  Pain Location: L hip Pain Descriptors / Indicators: Aching, Discomfort Pain Intervention(s): Monitored during session, Premedicated before session, Repositioned, Ice applied  Home Living Family/patient expects to be discharged to:: Private residence Living Arrangements: Spouse/significant other (wife) Available Help at Discharge: Family;Available 24 hours/day Type of Home: Mobile home Home Access:  Stairs to enter Entrance Stairs-Rails: Can reach both Entrance Stairs-Number of Steps: 5-6   Home Layout: One level Home Equipment: Conservation officer, nature (2 wheels);Cane - quad      Prior Function Prior Level of Function : Independent/Modified Independent             Mobility Comments: Ind community ambulator using cane, no hx of falls ADLs Comments: Ind w/ ADLs     Hand Dominance        Extremity/Trunk Assessment   Upper Extremity Assessment Upper Extremity Assessment: Overall WFL for tasks assessed    Lower Extremity Assessment Lower Extremity Assessment: Generalized weakness       Communication   Communication: No difficulties  Cognition Arousal/Alertness: Awake/alert Behavior During Therapy: WFL for tasks assessed/performed Overall Cognitive Status: Within Functional Limits for tasks assessed                                          General Comments      Exercises Total Joint Exercises Ankle Circles/Pumps: Strengthening, Both, 10 reps Quad Sets: Strengthening, Both, 10 reps Gluteal Sets: Strengthening, Both, 10 reps Heel Slides: Strengthening, Both, 10 reps Long Arc Quad: Strengthening, Both, 10 reps Other Exercises Other Exercises: Pt education on anterior hip precautions Other Exercises: HEP education per handout   Assessment/Plan    PT Assessment Patient needs continued PT services  PT Problem List Decreased strength;Decreased mobility;Decreased range of motion;Decreased activity tolerance;Decreased balance;Decreased knowledge of use of DME       PT Treatment Interventions Therapeutic exercise;DME instruction;Gait training;Balance training;Stair training;Functional mobility training;Therapeutic activities;Patient/family education    PT Goals (Current goals can be found in the Care Plan section)  Acute Rehab PT Goals Patient Stated Goal: Pt would like to get back to walking and start going to the gym PT Goal Formulation: With  patient Time For Goal Achievement: 01/10/22 Potential to Achieve Goals: Good    Frequency BID     Co-evaluation               AM-PAC PT "6 Clicks" Mobility  Outcome Measure Help needed turning from your back to your side while in a flat bed without using bedrails?: A Little Help needed moving from lying on your back to sitting on the side of a flat bed without using bedrails?: A Little Help needed moving to and from a bed to a chair (including a wheelchair)?: A Little Help needed standing up from a chair using your arms (e.g., wheelchair or bedside chair)?: A Little Help needed to walk in hospital room?: A Little Help needed climbing 3-5 steps with a railing? : A Little 6 Click Score: 18    End of Session Equipment Utilized During Treatment: Gait belt Activity Tolerance: Patient tolerated treatment well Patient left: in chair;with call bell/phone within reach;with chair alarm set;with family/visitor present Nurse Communication: Mobility status; RN notified of pt being left on room air w/ SpO2 WNL PT Visit Diagnosis: Other abnormalities of gait and mobility (R26.89);Muscle weakness (generalized) (M62.81);Pain Pain - Right/Left: Left Pain - part of body: Hip    Time: 1559-1640 PT Time Calculation (min) (ACUTE ONLY): 41 min   Charges:  Turner Daniels, SPT  12/28/2021, 5:03 PM

## 2021-12-28 NOTE — Progress Notes (Signed)
PHARMACIST - PHYSICIAN ORDER COMMUNICATION  CONCERNING: P&T Medication Policy on Herbal Medications  DESCRIPTION:  This patient's order for:  Coenzyme Q  has been noted.  This product(s) is classified as an "herbal" or natural product. Due to a lack of definitive safety studies or FDA approval, nonstandard manufacturing practices, plus the potential risk of unknown drug-drug interactions while on inpatient medications, the Pharmacy and Therapeutics Committee does not permit the use of "herbal" or natural products of this type within [facility]   ACTION TAKEN: The pharmacy department is unable to verify this order at this time and your patient has been informed of this safety policy. Please reevaluate patient's clinical condition at discharge and address if the herbal or natural product(s) should be resumed at that time.

## 2021-12-28 NOTE — H&P (Signed)
Chief Complaint  Patient presents with   Left Hip - Follow-up    History of the Present Illness: Frank Golden is a 66 y.o. male here today for H and P for a left total hip arthroplasty scheduled for 12/28/2021.  The patient states he has seen atrophy in his left leg.  The patient has never had blood clots.  The patient lives with his wife.  I have reviewed past medical, surgical, social and family history, and allergies as documented in the EMR.  Past Medical History: Past Medical History:  Diagnosis Date   Allergic state   Depression   DJD (degenerative joint disease)   Erectile dysfunction  with testosterone deficiency   History of chicken pox   History of pancreatitis   Hyperlipidemia   Hypertension   Hypothyroid   OCD (obsessive compulsive disorder)   Psoriasis   Rheumatoid arthritis (CMS-HCC)   Sleep apnea  Severe. Uses CPAP.   Past Surgical History: Past Surgical History:  Procedure Laterality Date   APPENDECTOMY 1973   CYSTECTOMY 2007  Removed from back   [place] 08/12/2005  Decompression, L4-5 central and lateral with partial facetectomy, foraminotomy and discectomy.   ENDOSCOPIC CARPAL TUNNEL RELEASE Bilateral 2009  Left 04/18/2008, Right 04/04/2008   MOHS SURGERY 10/2009  Basal cell carcinoma of nose   TONSILLECTOMY   Past Family History: Family History  Problem Relation Age of Onset   High blood pressure (Hypertension) Mother   Osteoporosis (Thinning of bones) Mother   Penile cancer Father   Stroke Father   Coronary Artery Disease (Blocked arteries around heart) Father   High blood pressure (Hypertension) Father   Myocardial Infarction (Heart attack) Father   Heart disease Father   High blood pressure (Hypertension) Sister   Other Sister  hip replacement   Other Brother  hip & knee replacement   No Known Problems Brother   No Known Problems Maternal Grandmother   No Known Problems Maternal Grandfather   No Known Problems  Paternal Grandmother   No Known Problems Paternal Grandfather   No Known Problems Daughter   Medications: Current Outpatient Medications Ordered in Epic  Medication Sig Dispense Refill   *saw palmetto oral once daily   albuterol sulfate (VENTOLIN HFA INHAL) Inhale into the lungs once daily As needed   amLODIPine (NORVASC) 5 MG tablet Take 1 tablet (5 mg total) by mouth once daily 30 tablet 11   B1/B2/niacin/B12/protease (B-COMPLEX WITH B-12 ORAL) Take by mouth 2 tabs in am   busPIRone (BUSPAR) 30 MG tablet TAKE ONE TABLET BY MOUTH TWICE A DAY 60 tablet 11   cetirizine (ZYRTEC) 10 MG tablet TAKE ONE TABLET BY MOUTH DAILY 90 tablet 2   coenzyme Q10-vitamin E 100-5 mg-unit Cap Take 100 mg by mouth once daily   EPINEPHrine (EPIPEN) 0.3 mg/0.3 mL auto-injector Inject 0.25 mLs (0.25 mg total) into the muscle once daily as needed 1 pen 2   fluticasone (FLONASE) 50 mcg/actuation nasal spray Place 2 sprays into both nostrils once daily as needed. (Patient taking differently: Place 2 sprays into both nostrils once daily) 16 g 2   FORMOTEROL FUMARATE INHAL Inhale 1 inhalation into the lungs every morning   hydroCHLOROthiazide (HYDRODIURIL) 25 MG tablet TAKE ONE TABLET BY MOUTH DAILY 90 tablet 1   levothyroxine (SYNTHROID) 125 MCG tablet TAKE ONE TABLET BY MOUTH ONCE DAILY ON AN EMPTY STOMACH WITH A GLASS OF WATER AT LEAST 30-60 MINUTES BEFORE BREAKFAST 90 tablet 1   meloxicam (MOBIC) 15 MG  tablet Take 1 tablet (15 mg total) by mouth once daily as needed for Pain 30 tablet 1   multivitamin tablet Take 1 tablet by mouth once daily.   peg 400-propylene glycol (SYSTANE ULTRA) 0.4-0.3 % drops Place 1 drop into both eyes 2 (two) times daily as needed   pravastatin (PRAVACHOL) 20 MG tablet TAKE ONE TABLET BY MOUTH NIGHTLY 90 tablet 1   QUEtiapine (SEROQUEL) 50 MG tablet Take 1 tablet (50 mg total) by mouth at bedtime 90 tablet 3   tiZANidine (ZANAFLEX) 4 MG tablet Take 1 tablet (4 mg total) by mouth 3 (three)  times daily as needed 60 tablet 11   triamcinolone 0.1 % cream APPLY TOPICALLY TO RASH TWO TIMES DAILY AS NEEDED 30 g 2   TURMERIC ROOT EXTRACT ORAL Take 1 tablet by mouth once daily   valsartan (DIOVAN) 320 MG tablet TAKE ONE TABLET BY MOUTH DAILY 90 tablet 1   nortriptyline (PAMELOR) 50 MG capsule Take 1 capsule (50 mg total) by mouth at bedtime for 30 days 30 capsule 3   peg 400-propylene glycol, PF, (SYSTANE ULTRA) 0.4-0.3 % ophthalmic drops Place 1 drop into both eyes 2 (two) times daily (Patient not taking: Reported on 12/20/2021)   No current Epic-ordered facility-administered medications on file.   Allergies: Allergies  Allergen Reactions   Others Anaphylaxis  Uncoded Allergy. Allergen: bee stings   Gabapentin Anxiety  Felt like jumping out of skin   Covid-19 (Sars-Cov-2) Vaccine, Ad26 Vomiting   Covid-19 Vaccine, Mrna, [id number], Lnp-S (Pfizer) Nausea And Vomiting  Vomiting x 2   Venom-Honey Bee Other (See Comments)   Accupril [Quinapril] Cough   Mirtazapine Other (See Comments)  NUMBNESS OF FACE NUMBNESS OF FACE   Quinapril Hcl Cough    Body mass index is 37.9 kg/m.  Review of Systems: A comprehensive 14 point ROS was performed, reviewed, and the pertinent orthopaedic findings are documented in the HPI.  Vitals:  12/20/21 0832  BP: 132/78    General Physical Examination:   General/Constitutional: No apparent distress: well-nourished and well developed. Eyes: Pupils equal, round with synchronous movement. Lungs: Clear to auscultation HEENT: Normal Vascular: No edema, swelling or tenderness, except as noted in detailed exam. Cardiac: Heart rate and rhythm is regular. Integumentary: No impressive skin lesions present, except as noted in detailed exam. Neuro/Psych: Normal mood and affect, oriented to person, place and time.  Musculoskeletal Examination: On exam, left hip has -20 degrees internal rotation and 40 degrees external. Flexion contracture of 20 degrees.  Very antalgic gait with the hip flexed and knee flexed as well.  Radiographs:  AP pelvis and lateral x-rays of the left hip from 11/22/2021 were reviewed today. These show complete loss of superior joint space, deformity to the head, probably an old CAM lesion, impingement causing this. There is subchondral cyst formation and severe osteoarthritis. There is moderate osteoarthritis of the right hip.  Assessment: ICD-10-CM  1. Primary localized osteoarthritis of left hip M16.12   Plan:  The patient has clinical findings of severe osteoarthritis of the left hip.  We discussed the patient's x-ray findings. We will plan for left total hip arthroplasty in 8 days. I explained the surgery and postoperative course in detail. I advised him to ambulate 2 weeks with the walker and 2 weeks with a cane after surgery.  Surgical Risks:  The nature of the condition and the proposed procedure has been reviewed in detail with the patient. Surgical versus non-surgical options and prognosis for recovery have been  reviewed and the inherent risks and benefits of each have been discussed including the risks of infection, bleeding, injury to nerves/blood vessels/tendons, incomplete relief of symptoms, persisting pain and/or stiffness, loss of function, complex regional pain syndrome, failure of the procedure, as appropriate.  Teeth: Nothing removable.  Document Attestation: Debbrah Alar, have reviewed and updated documentation for [facility], MD, utilizing Nuance DAX.   Electronically signed by Lauris Poag, MD at 12/21/2021 2:30 PM EDT  Reviewed  H+P. No changes noted.

## 2021-12-28 NOTE — Anesthesia Procedure Notes (Signed)
Procedure Name: Intubation Date/Time: 12/28/2021 11:24 AM  Performed by: Lia Foyer, CRNAPre-anesthesia Checklist: Patient identified, Emergency Drugs available, Suction available and Patient being monitored Patient Re-evaluated:Patient Re-evaluated prior to induction Oxygen Delivery Method: Circle system utilized Preoxygenation: Pre-oxygenation with 100% oxygen Induction Type: IV induction Ventilation: Mask ventilation without difficulty Laryngoscope Size: Mac and 4 Grade View: Grade II Tube type: Oral Tube size: 7.5 mm Number of attempts: 1 Airway Equipment and Method: Stylet and Oral airway Placement Confirmation: ETT inserted through vocal cords under direct vision, positive ETCO2 and breath sounds checked- equal and bilateral Secured at: 23 cm Tube secured with: Tape Dental Injury: Teeth and Oropharynx as per pre-operative assessment  Comments: Placed by Lysle Dingwall Timcheck

## 2021-12-29 ENCOUNTER — Encounter: Payer: Self-pay | Admitting: Orthopedic Surgery

## 2021-12-29 DIAGNOSIS — M1612 Unilateral primary osteoarthritis, left hip: Secondary | ICD-10-CM | POA: Diagnosis not present

## 2021-12-29 LAB — BASIC METABOLIC PANEL
Anion gap: 6 (ref 5–15)
BUN: 14 mg/dL (ref 8–23)
CO2: 26 mmol/L (ref 22–32)
Calcium: 8.5 mg/dL — ABNORMAL LOW (ref 8.9–10.3)
Chloride: 105 mmol/L (ref 98–111)
Creatinine, Ser: 0.95 mg/dL (ref 0.61–1.24)
GFR, Estimated: >60 mL/min (ref >60)
Glucose, Bld: 161 mg/dL — ABNORMAL HIGH (ref 70–99)
Potassium: 3.3 mmol/L — ABNORMAL LOW (ref 3.5–5.1)
Sodium: 137 mmol/L (ref 135–145)

## 2021-12-29 LAB — CBC
HCT: 36.7 % — ABNORMAL LOW (ref 39.0–52.0)
Hemoglobin: 12.5 g/dL — ABNORMAL LOW (ref 13.0–17.0)
MCH: 30 pg (ref 26.0–34.0)
MCHC: 34.1 g/dL (ref 30.0–36.0)
MCV: 88 fL (ref 80.0–100.0)
Platelets: 263 10*3/uL (ref 150–400)
RBC: 4.17 MIL/uL — ABNORMAL LOW (ref 4.22–5.81)
RDW: 13.3 % (ref 11.5–15.5)
WBC: 13.3 10*3/uL — ABNORMAL HIGH (ref 4.0–10.5)
nRBC: 0 % (ref 0.0–0.2)

## 2021-12-29 MED ORDER — TRAMADOL HCL 50 MG PO TABS: 50.0000 mg | ORAL_TABLET | Freq: Four times a day (QID) | ORAL | 0 refills | Status: DC

## 2021-12-29 MED ORDER — ENOXAPARIN SODIUM 40 MG/0.4ML IJ SOSY: 40.0000 mg | PREFILLED_SYRINGE | INTRAMUSCULAR | 0 refills | Status: DC

## 2021-12-29 MED ORDER — ALBUTEROL SULFATE (2.5 MG/3ML) 0.083% IN NEBU: 3.0000 mL | INHALATION_SOLUTION | RESPIRATORY_TRACT | Status: DC | PRN

## 2021-12-29 MED ORDER — HYDROCODONE-ACETAMINOPHEN 7.5-325 MG PO TABS: 1.0000 | ORAL_TABLET | ORAL | 0 refills | Status: DC | PRN

## 2021-12-29 MED ORDER — POTASSIUM CHLORIDE CRYS ER 20 MEQ PO TBCR
20.0000 meq | EXTENDED_RELEASE_TABLET | Freq: Two times a day (BID) | ORAL | Status: DC
  Administered 2021-12-29: 20 meq via ORAL
  Filled 2021-12-29: qty 1

## 2021-12-29 NOTE — Progress Notes (Signed)
Physical Therapy Treatment Patient Details Name: Frank Golden MRN: [id number] DOB: 12/06/1955 Today's Date: 12/29/2021   History of Present Illness Pt is a 66 yo M s/p L THA 12/28/21. PMH includes asthma, HTN, HLD, RA, arthritis, hypothyroidism.    PT Comments    Pt was pleasant and motivated to participate during the session and put forth good effort throughout. Pt completes supine<>sit w/ supervision and extra time and effort. Pt performs sit to stands with supervision but still hesitant using LLE when initiating stand. Pt ambulates using RW w/ CGA with slow steady gait w/ no LOB. Still fairly reliant for UE support during gait but does have improved cadence as walk progressed. Pt is able to complete stairs w/ CGA following visual/verbal cuing and is stable throughout with good concentric/eccentric control using RLE. Pt will benefit from HHPT upon discharge to safely address deficits listed in patient problem list for decreased caregiver assistance and eventual return to PLOF.    Recommendations for follow up therapy are one component of a multi-disciplinary discharge planning process, led by the attending physician.  Recommendations may be updated based on patient status, additional functional criteria and insurance authorization.  Follow Up Recommendations  Home health PT     Assistance Recommended at Discharge Intermittent Supervision/Assistance  Patient can return home with the following A little help with walking and/or transfers;A little help with bathing/dressing/bathroom;Assistance with cooking/housework;Assist for transportation;Help with stairs or ramp for entrance   Equipment Recommendations  BSC/3in1    Recommendations for Other Services       Precautions / Restrictions Precautions Precautions: Fall;Anterior Hip Precaution Booklet Issued: Yes (comment) Restrictions Weight Bearing Restrictions: Yes LLE Weight Bearing: Weight bearing as tolerated     Mobility  Bed  Mobility Overal bed mobility: Needs Assistance Bed Mobility: Supine to Sit     Supine to sit: Supervision     General bed mobility comments: extra time and effort to complete    Transfers Overall transfer level: Needs assistance Equipment used: Rolling walker (2 wheels) Transfers: Sit to/from Stand Sit to Stand: Supervision           General transfer comment: min cuing for hand placement when initiating stand    Ambulation/Gait Ambulation/Gait assistance: Min guard Gait Distance (Feet): 120 Feet x1; 40 Feet x1 Assistive device: Rolling walker (2 wheels) Gait Pattern/deviations: Step-to pattern, Decreased step length - right, Decreased step length - left, Decreased stance time - left Gait velocity: decreased     General Gait Details: slow steady gait w/ no LOB; still fairly reliant on UE support when advancing RLE   Stairs Stairs: Yes Stairs assistance: Min guard Stair Management: Two rails, Step to pattern, Forwards Number of Stairs: 4 x2 General stair comments: steady throughout, with min cuing needed for sequencing   Wheelchair Mobility    Modified Rankin (Stroke Patients Only)       Balance Overall balance assessment: Needs assistance Sitting-balance support: Bilateral upper extremity supported, Feet supported Sitting balance-Leahy Scale: Good     Standing balance support: Bilateral upper extremity supported, During functional activity Standing balance-Leahy Scale: Fair                              Cognition Arousal/Alertness: Awake/alert Behavior During Therapy: WFL for tasks assessed/performed Overall Cognitive Status: Within Functional Limits for tasks assessed  Exercises Other Exercises Other Exercises: Pt education on stair sequencing Other Exercises: Pt education on car transfer sequencing    General Comments        Pertinent Vitals/Pain Pain Assessment Pain  Assessment: 0-10 Pain Score: 3  (3 at rest, 5 after activity) Pain Location: L hip Pain Descriptors / Indicators: Aching, Discomfort Pain Intervention(s): Monitored during session, Premedicated before session, Repositioned    Home Living                          Prior Function            PT Goals (current goals can now be found in the care plan section) Progress towards PT goals: Progressing toward goals    Frequency    BID      PT Plan Current plan remains appropriate    Co-evaluation              AM-PAC PT "6 Clicks" Mobility   Outcome Measure  Help needed turning from your back to your side while in a flat bed without using bedrails?: A Little Help needed moving from lying on your back to sitting on the side of a flat bed without using bedrails?: A Little Help needed moving to and from a bed to a chair (including a wheelchair)?: A Little Help needed standing up from a chair using your arms (e.g., wheelchair or bedside chair)?: A Little Help needed to walk in hospital room?: A Little Help needed climbing 3-5 steps with a railing? : A Little 6 Click Score: 18    End of Session Equipment Utilized During Treatment: Gait belt Activity Tolerance: Patient tolerated treatment well Patient left: in chair;with call bell/phone within reach;with chair alarm set Nurse Communication: Mobility status PT Visit Diagnosis: Other abnormalities of gait and mobility (R26.89);Muscle weakness (generalized) (M62.81);Pain Pain - Right/Left: Left Pain - part of body: Hip     Time: 9983-3825 PT Time Calculation (min) (ACUTE ONLY): 44 min  Charges:                       Turner Daniels, SPT  12/29/2021, 11:50 AM

## 2021-12-29 NOTE — Plan of Care (Signed)
  Problem: Education: Goal: Knowledge of General Education information will improve Description: Including pain rating scale, medication(s)/side effects and non-pharmacologic comfort measures Outcome: Progressing   Problem: Health Behavior/Discharge Planning: Goal: Ability to manage health-related needs will improve Outcome: Progressing   Problem: Clinical Measurements: Goal: Ability to maintain clinical measurements within normal limits will improve Outcome: Progressing Goal: Will remain free from infection Outcome: Progressing Goal: Diagnostic test results will improve Outcome: Progressing Goal: Respiratory complications will improve Outcome: Progressing Goal: Cardiovascular complication will be avoided Outcome: Progressing   Problem: Activity: Goal: Risk for activity intolerance will decrease Outcome: Progressing   Problem: Nutrition: Goal: Adequate nutrition will be maintained Outcome: Progressing   Problem: Coping: Goal: Level of anxiety will decrease Outcome: Progressing   Problem: Elimination: Goal: Will not experience complications related to bowel motility Outcome: Progressing Goal: Will not experience complications related to urinary retention Outcome: Progressing   Problem: Pain Managment: Goal: General experience of comfort will improve Outcome: Progressing   Problem: Safety: Goal: Ability to remain free from injury will improve Outcome: Progressing   Problem: Skin Integrity: Goal: Risk for impaired skin integrity will decrease Outcome: Progressing   Problem: Education: Goal: Knowledge of the prescribed therapeutic regimen will improve Outcome: Progressing   Problem: Activity: Goal: Ability to avoid complications of mobility impairment will improve Outcome: Progressing Goal: Ability to tolerate increased activity will improve Outcome: Progressing   Problem: Clinical Measurements: Goal: Postoperative complications will be avoided or  minimized Outcome: Progressing   Problem: Pain Management: Goal: Pain level will decrease with appropriate interventions Outcome: Progressing   Problem: Skin Integrity: Goal: Will show signs of wound healing Outcome: Progressing

## 2021-12-29 NOTE — Discharge Summary (Signed)
Physician Discharge Summary  Patient ID: Frank Golden MRN: [id number] DOB/AGE: February 25, 1956 66 y.o.  Admit date: 12/28/2021 Discharge date: 12/29/2021  Admission Diagnoses:  Status post total hip replacement, left [Z96.642]   Discharge Diagnoses: Patient Active Problem List   Diagnosis Date Noted   Status post total hip replacement, left 12/28/2021   Major depressive disorder, recurrent episode with anxious distress ([place]) 12/17/2018   Insomnia due to mental disorder 12/17/2018   Hyperlipidemia 07/26/2018   Depression 07/26/2018   Allergy 07/26/2018   Psoriasis 03/08/2018   Polyarthralgia 03/08/2018   Chronic insomnia 10/03/2016   GAD (generalized anxiety disorder) 10/03/2016   MCI (mild cognitive impairment) with memory loss 10/03/2016   Pituitary lesion ([place]) 10/03/2016   Hypothyroidism due to acquired atrophy of thyroid 11/23/2015   Essential hypertension 11/23/2015   Primary osteoarthritis involving multiple joints 11/23/2015   COPD (chronic obstructive pulmonary disease) ([place]) 09/25/2014   OSA on CPAP 09/25/2014   Obesity 09/25/2014    Past Medical History:  Diagnosis Date   Allergic state    Asthma    Cancer ([place])    skin cancer on face   Depression    DJD (degenerative joint disease)    ED (erectile dysfunction)    History of chicken pox    History of pancreatitis    Hyperlipidemia    Hypertension    Hypertension    Hypothyroid    OCD (obsessive compulsive disorder)    Psoriasis    Rheumatoid arthritis ([place])    Sleep apnea    uses CPAP     Transfusion: none   Consultants (if any):   Discharged Condition: Improved  Hospital Course: Frank Golden is an 66 y.o. male who was admitted 12/28/2021 with a diagnosis of Status post total hip replacement, left and went to the operating room on 12/28/2021 and underwent the above named procedures.    Surgeries: Procedure(s): TOTAL HIP ARTHROPLASTY ANTERIOR APPROACH on 12/28/2021 Patient tolerated the surgery well.  Taken to PACU where she was stabilized and then transferred to the orthopedic floor.  Started on Lovenox 40 mg q 24 hrs. Foot pumps applied bilaterally at 80 mm. Heels elevated on bed with rolled towels. No evidence of DVT. Negative [place]. Physical therapy started on day #1 for gait training and transfer. OT started day #1 for ADL and assisted devices.  Patient's foley was d/c on day #1. Patient's IV was d/c on day #1.  On post op day #1 patient was stable and ready for discharge to home with HHPT.  He was given perioperative antibiotics:  Anti-infectives (From admission, onward)    Start     Dose/Rate Route Frequency Ordered Stop   12/28/21 1445  ceFAZolin (ANCEF) IVPB 2g/100 mL premix        2 g 200 mL/hr over 30 Minutes Intravenous Every 6 hours 12/28/21 1359 12/28/21 2230   12/28/21 0903  ceFAZolin (ANCEF) 2-4 GM/100ML-% IVPB       Note to Pharmacy: Trudie Reed S: cabinet override      12/28/21 0903 12/28/21 1641   12/28/21 0900  ceFAZolin (ANCEF) IVPB 2g/100 mL premix        2 g 200 mL/hr over 30 Minutes Intravenous On call to O.R. 12/28/21 9390 12/28/21 1204     .  He was given sequential compression devices, early ambulation, and Lovenox TEDs for DVT prophylaxis.  He benefited maximally from the hospital stay and there were no complications.    Recent vital signs:  Vitals:   12/29/21  6967 12/29/21 0911  BP:  115/64  Pulse: 89 (!) 104  Resp: 18 16  Temp:  97.6 F (36.4 C)  SpO2: 97% 96%    Recent laboratory studies:  Lab Results  Component Value Date   HGB 12.5 (L) 12/29/2021   HGB 13.1 12/28/2021   HGB 13.6 01/04/2013   Lab Results  Component Value Date   WBC 13.3 (H) 12/29/2021   PLT 263 12/29/2021   No results found for: "INR" Lab Results  Component Value Date   NA 137 12/29/2021   K 3.3 (L) 12/29/2021   CL 105 12/29/2021   CO2 26 12/29/2021   BUN 14 12/29/2021   CREATININE 0.95 12/29/2021   GLUCOSE 161 (H) 12/29/2021    Discharge Medications:    Allergies as of 12/29/2021       Reactions   Bee Venom Anaphylaxis   Gabapentin Other (See Comments)   Felt like jumping out of skin   Covid-19 (mrna) Vaccine Nausea And Vomiting   Vomiting x 2   Quinapril Hcl Cough   Remeron [mirtazapine] Other (See Comments)   NUMBNESS OF FACE        Medication List     STOP taking these medications    meloxicam 15 MG tablet Commonly known as: MOBIC       TAKE these medications    acetaminophen 500 MG tablet Commonly known as: TYLENOL Take 1,000 mg by mouth 3 (three) times daily as needed.   ALBUTEROL SULFATE IN Inhale into the lungs.   albuterol 108 (90 Base) MCG/ACT inhaler Commonly known as: VENTOLIN HFA Inhale 2 puffs into the lungs as needed.   amLODipine 5 MG tablet Commonly known as: NORVASC Take 5 mg by mouth daily.   B-Complex/B-12 Tabs Take 1 tablet by mouth 2 (two) times daily.   budesonide-formoterol 160-4.5 MCG/ACT inhaler Commonly known as: SYMBICORT Inhale 2 puffs into the lungs 2 (two) times daily. What changed:  how much to take when to take this   busPIRone 30 MG tablet Commonly known as: BUSPAR Take 30 mg by mouth 2 (two) times daily.   cetirizine 10 MG tablet Commonly known as: ZYRTEC Take 10 mg by mouth daily.   Coenzyme Q-10 100 MG capsule Take 100 mg by mouth daily.   enoxaparin 40 MG/0.4ML injection Commonly known as: LOVENOX Inject 0.4 mLs (40 mg total) into the skin daily for 14 days. Start taking on: December 30, 2021   EPINEPHrine 0.3 mg/0.3 mL Soaj injection Commonly known as: EPI-PEN Inject 0.3 mg into the muscle as needed.   fluticasone 50 MCG/ACT nasal spray Commonly known as: FLONASE Place 2 sprays into the nose daily.   hydrochlorothiazide 25 MG tablet Commonly known as: HYDRODIURIL Take 25 mg by mouth daily.   HYDROcodone-acetaminophen 7.5-325 MG tablet Commonly known as: NORCO Take 1-2 tablets by mouth every 4 (four) hours as needed for severe pain (pain score  7-10).   Levothyroxine Sodium 125 MCG Caps Take 125 mcg by mouth daily before breakfast.   MULTIVITAMIN ADULT PO Take 1 tablet by mouth daily.   nortriptyline 50 MG capsule Commonly known as: PAMELOR Take 50 mg by mouth at bedtime.   Polyethyl Glycol-Propyl Glycol 0.4-0.3 % Soln Apply 1 drop to eye 2 (two) times daily as needed.   pravastatin 20 MG tablet Commonly known as: PRAVACHOL Take 20 mg by mouth at bedtime.   QUEtiapine 50 MG tablet Commonly known as: SEROQUEL Take 50 mg by mouth at bedtime.   saw  palmetto 160 MG capsule Take 1 capsule by mouth daily.   tiZANidine 4 MG capsule Commonly known as: ZANAFLEX Take 4 mg by mouth 3 (three) times daily as needed for muscle spasms.   traMADol 50 MG tablet Commonly known as: ULTRAM Take 1 tablet (50 mg total) by mouth every 6 (six) hours.   TURMERIC PO Take 1 capsule by mouth daily.   valsartan 320 MG tablet Commonly known as: DIOVAN Take 320 mg by mouth daily.               Durable Medical Equipment  (From admission, onward)           Start     Ordered   12/28/21 1707  For home use only DME 3 n 1  Once        12/28/21 1706   12/28/21 1400  DME 3 n 1  Once        12/28/21 1359   12/28/21 1400  DME Walker rolling  Once       Question Answer Comment  Walker: With 5 Inch Wheels   Patient needs a walker to Golden with the following condition Status post total hip replacement, left      12/28/21 1359   12/28/21 1400  DME Bedside commode  Once       Question:  Patient needs a bedside commode to Golden with the following condition  Answer:  Status post total hip replacement, left   12/28/21 1359            Diagnostic Studies: DG HIP UNILAT W OR W/O PELVIS 2-3 VIEWS LEFT  Result Date: 12/28/2021 CLINICAL DATA:  Left total hip arthroplasty EXAM: DG HIP (WITH OR WITHOUT PELVIS) 2-3V LEFT COMPARISON:  Operative images same day FINDINGS: Total hip arthroplasty has a good appearance without evidence of  complication or unexpected finding. IMPRESSION: Good appearance following left total hip arthroplasty. Electronically Signed   By: Nelson Chimes M.D.   On: 12/28/2021 13:34   DG HIP UNILAT WITH PELVIS 1V LEFT  Result Date: 12/28/2021 CLINICAL DATA:  Left hip replacement EXAM: DG HIP (WITH OR WITHOUT PELVIS) 1V*L* COMPARISON:  None Available. FINDINGS: Fluoroscopic images were obtained intraoperatively and submitted for post operative interpretation. Left total hip arthroplasty with hardware in expected position, 3 images were obtained with 10 seconds of fluoroscopy time and 2.26 mGy. Please see the performing provider's procedural report for further detail. IMPRESSION: Fluoroscopic images of left total hip arthroplasty. Electronically Signed   By: Yetta Glassman M.D.   On: 12/28/2021 12:51   DG C-Arm 1-60 Min-No Report  Result Date: 12/28/2021 Fluoroscopy was utilized by the requesting physician.  No radiographic interpretation.   DG C-Arm 1-60 Min-No Report  Result Date: 12/28/2021 Fluoroscopy was utilized by the requesting physician.  No radiographic interpretation.    Disposition:      Follow-up Information     Duanne Guess, PA-C Follow up in 2 week(s).   Specialties: Orthopedic Surgery, Emergency Medicine Contact information: [place] [address] [phone]                  Signed: Feliberto Gottron 12/29/2021, 9:23 AM

## 2021-12-29 NOTE — Progress Notes (Signed)
   Subjective: 1 Day Post-Op Procedure(s) (LRB): TOTAL HIP ARTHROPLASTY ANTERIOR APPROACH (Left) Patient reports pain as mild.   Patient is well, and has had no acute complaints or problems Denies any CP, SOB, ABD pain. We will continue therapy today.  Plan is to go Home after hospital stay.  Objective: Vital signs in last 24 hours: Temp:  [97.2 F (36.2 C)-98.1 F (36.7 C)] 97.6 F (36.4 C) (07/19 0911) Pulse Rate:  [85-106] 104 (07/19 0911) Resp:  [14-25] 16 (07/19 0911) BP: (115-145)/(64-98) 115/64 (07/19 0911) SpO2:  [92 %-98 %] 96 % (07/19 0911) Weight:  [397 kg] 108 kg (07/18 0926)  Intake/Output from previous day: 07/18 0701 - 07/19 0700 In: 1820 [P.O.:720; I.V.:1000; IV Piggyback:100] Out: 1100 [Urine:1000; Blood:100] Intake/Output this shift: No intake/output data recorded.  Recent Labs    12/28/21 1459 12/29/21 0327  HGB 13.1 12.5*   Recent Labs    12/28/21 1459 12/29/21 0327  WBC 12.2* 13.3*  RBC 4.39 4.17*  HCT 38.9* 36.7*  PLT 247 263   Recent Labs    12/28/21 1459 12/29/21 0327  NA  --  137  K  --  3.3*  CL  --  105  CO2  --  26  BUN  --  14  CREATININE 0.96 0.95  GLUCOSE  --  161*  CALCIUM  --  8.5*   No results for input(s): "LABPT", "INR" in the last 72 hours.  EXAM General - Patient is Alert, Appropriate, and Oriented Extremity - Sensation intact distally Intact pulses distally Dorsiflexion/Plantar flexion intact No cellulitis present Compartment soft Dressing - dressing C/D/I and no drainage, provena intact with 50 cc drainage Motor Function - intact, moving foot and toes well on exam.   Past Medical History:  Diagnosis Date   Allergic state    Asthma    Cancer ([place])    skin cancer on face   Depression    DJD (degenerative joint disease)    ED (erectile dysfunction)    History of chicken pox    History of pancreatitis    Hyperlipidemia    Hypertension    Hypertension    Hypothyroid    OCD (obsessive compulsive  disorder)    Psoriasis    Rheumatoid arthritis ([place])    Sleep apnea    uses CPAP    Assessment/Plan:   1 Day Post-Op Procedure(s) (LRB): TOTAL HIP ARTHROPLASTY ANTERIOR APPROACH (Left) Principal Problem:   Status post total hip replacement, left  Estimated body mass index is 36.74 kg/m as calculated from the following:   Height as of this encounter: 5' 7.5" (1.715 m).   Weight as of this encounter: 108 kg. Advance diet Up with therapy VSS Pain controlled CM to assist with discharge to home with HHPT today pending completion of PT goals  DVT Prophylaxis - Lovenox, TED hose, and SCDS Weight-Bearing as tolerated to left leg   T. Rachelle Hora, PA-C [place] 12/29/2021, 9:17 AM

## 2021-12-29 NOTE — Progress Notes (Cosign Needed)
Patient is not able to walk the distance required to go the bathroom, or he/she is unable to safely negotiate stairs required to access the bathroom.  A 3in1 BSC will alleviate this problem  

## 2021-12-29 NOTE — Progress Notes (Signed)
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

## 2021-12-29 NOTE — Discharge Instructions (Signed)

## 2021-12-30 LAB — SURGICAL PATHOLOGY

## 2022-04-06 NOTE — H&P (Incomplete)
Pre-Procedure H&P   Patient ID: Frank Golden is a 66 y.o. male.  Gastroenterology Provider: Annamaria Helling, DO  Referring Provider: Dr. Caryl Comes PCP: Adin Hector, MD  Date: 04/07/2022  HPI [name] is a 66 y.o. male who presents today for Colonoscopy for surveillance- phx colon polyps.  Last Csy 11/2010- one sigmoid TA (pedunculated) with rec'd repeat in 2015; L sided diverticulosis  Qod BM w/o melena or hematochezia No fhx crc. Brother with benign colon polyps  Hgb 12.5 mcv 88 plt 263K a1c 5.9  S/p L hip replacement   Past Medical History:  Diagnosis Date   Allergic state    Asthma    Cancer ([place])    skin cancer on face   Depression    DJD (degenerative joint disease)    ED (erectile dysfunction)    History of chicken pox    History of pancreatitis    Hyperlipidemia    Hypertension    Hypertension    Hypothyroid    OCD (obsessive compulsive disorder)    Psoriasis    Rheumatoid arthritis ([place])    Sleep apnea    uses CPAP    Past Surgical History:  Procedure Laterality Date   APPENDECTOMY     CYSTECTOMY     endoscopic carpal tunnell     LAMINECTOMY     MOHS SURGERY     basal cell on nose   TOTAL HIP ARTHROPLASTY Left 12/28/2021   Procedure: TOTAL HIP ARTHROPLASTY ANTERIOR APPROACH;  Surgeon: Hessie Knows, MD;  Location: [facility] ORS;  Service: Orthopedics;  Laterality: Left;    Family History No h/o GI disease or malignancy  Review of Systems  Constitutional:  Negative for activity change, appetite change, chills, diaphoresis, fatigue, fever and unexpected weight change.  HENT:  Negative for trouble swallowing and voice change.   Respiratory:  Negative for shortness of breath and wheezing.   Cardiovascular:  Negative for chest pain, palpitations and leg swelling.  Gastrointestinal:  Positive for constipation. Negative for abdominal distention, abdominal pain, anal bleeding, blood in stool, diarrhea, nausea and vomiting.   Musculoskeletal:  Negative for arthralgias and myalgias.  Skin:  Negative for color change and pallor.  Neurological:  Negative for dizziness, syncope and weakness.  Psychiatric/Behavioral:  Negative for confusion. The patient is not nervous/anxious.   All other systems reviewed and are negative.    Medications No current facility-administered medications on file prior to encounter.   Current Outpatient Medications on File Prior to Encounter  Medication Sig Dispense Refill   amLODipine (NORVASC) 5 MG tablet Take 5 mg by mouth daily.     Levothyroxine Sodium 125 MCG CAPS Take 125 mcg by mouth daily before breakfast.     acetaminophen (TYLENOL) 500 MG tablet Take 1,000 mg by mouth 3 (three) times daily as needed.     albuterol (PROVENTIL HFA;VENTOLIN HFA) 108 (90 BASE) MCG/ACT inhaler Inhale 2 puffs into the lungs as needed.     ALBUTEROL SULFATE IN Inhale into the lungs.     B Complex Vitamins (B-COMPLEX/B-12) TABS Take 1 tablet by mouth 2 (two) times daily.     budesonide-formoterol (SYMBICORT) 160-4.5 MCG/ACT inhaler Inhale 2 puffs into the lungs 2 (two) times daily. (Patient taking differently: Inhale 1 puff into the lungs daily.) 1 Inhaler 6   busPIRone (BUSPAR) 30 MG tablet Take 30 mg by mouth 2 (two) times daily.     cetirizine (ZYRTEC) 10 MG tablet Take 10 mg by mouth daily.  Coenzyme Q-10 100 MG capsule Take 100 mg by mouth daily.     enoxaparin (LOVENOX) 40 MG/0.4ML injection Inject 0.4 mLs (40 mg total) into the skin daily for 14 days. 5.6 mL 0   EPINEPHrine 0.3 mg/0.3 mL IJ SOAJ injection Inject 0.3 mg into the muscle as needed.     fluticasone (FLONASE) 50 MCG/ACT nasal spray Place 2 sprays into the nose daily.     hydrochlorothiazide (HYDRODIURIL) 25 MG tablet Take 25 mg by mouth daily.     HYDROcodone-acetaminophen (NORCO) 7.5-325 MG tablet Take 1-2 tablets by mouth every 4 (four) hours as needed for severe pain (pain score 7-10). 30 tablet 0   Multiple Vitamins-Minerals  (MULTIVITAMIN ADULT PO) Take 1 tablet by mouth daily.     nortriptyline (PAMELOR) 50 MG capsule Take 50 mg by mouth at bedtime.     Polyethyl Glycol-Propyl Glycol 0.4-0.3 % SOLN Apply 1 drop to eye 2 (two) times daily as needed.     pravastatin (PRAVACHOL) 20 MG tablet Take 20 mg by mouth at bedtime.     QUEtiapine (SEROQUEL) 50 MG tablet Take 50 mg by mouth at bedtime.     saw palmetto 160 MG capsule Take 1 capsule by mouth daily.     tiZANidine (ZANAFLEX) 4 MG capsule Take 4 mg by mouth 3 (three) times daily as needed for muscle spasms.     traMADol (ULTRAM) 50 MG tablet Take 1 tablet (50 mg total) by mouth every 6 (six) hours. 30 tablet 0   TURMERIC PO Take 1 capsule by mouth daily.     valsartan (DIOVAN) 320 MG tablet Take 320 mg by mouth daily.      Pertinent medications related to GI and procedure were reviewed by me with the patient prior to the procedure   Current Facility-Administered Medications:    0.9 %  sodium chloride infusion, , Intravenous, Continuous, Annamaria Helling, DO, Last Rate: 20 mL/hr at 04/07/22 0741, New Bag at 04/07/22 0741      Allergies  Allergen Reactions   Bee Venom Anaphylaxis   Gabapentin Other (See Comments)    Felt like jumping out of skin   Covid-19 (Mrna) Vaccine Nausea And Vomiting    Vomiting x 2   Quinapril Hcl Cough   Remeron [Mirtazapine] Other (See Comments)    NUMBNESS OF FACE   Allergies were reviewed by me prior to the procedure  Objective   Body mass index is 34.72 kg/m. Vitals:   04/07/22 0722  BP: (!) 133/103  Pulse: 88  Resp: 20  Temp: (!) 96.3 F (35.7 C)  TempSrc: Temporal  SpO2: 98%  Weight: 102.1 kg  Height: 5' 7.5" (1.715 m)     Physical Exam Vitals and nursing note reviewed.  Constitutional:      General: He is not in acute distress.    Appearance: Normal appearance. He is obese. He is not ill-appearing, toxic-appearing or diaphoretic.  HENT:     Head: Normocephalic and atraumatic.     Nose: Nose  normal.     Mouth/Throat:     Mouth: Mucous membranes are moist.     Pharynx: Oropharynx is clear.  Eyes:     General: No scleral icterus.    Extraocular Movements: Extraocular movements intact.  Cardiovascular:     Rate and Rhythm: Normal rate and regular rhythm.     Heart sounds: Normal heart sounds. No murmur heard.    No friction rub. No gallop.  Pulmonary:     Effort:  Pulmonary effort is normal. No respiratory distress.     Breath sounds: Normal breath sounds. No wheezing, rhonchi or rales.  Abdominal:     General: Bowel sounds are normal. There is no distension.     Palpations: Abdomen is soft.     Tenderness: There is no abdominal tenderness. There is no guarding or rebound.  Musculoskeletal:     Cervical back: Neck supple.     Right lower leg: No edema.     Left lower leg: No edema.  Skin:    General: Skin is warm and dry.     Coloration: Skin is not jaundiced or pale.  Neurological:     General: No focal deficit present.     Mental Status: He is alert and oriented to person, place, and time. Mental status is at baseline.  Psychiatric:        Mood and Affect: Mood normal.        Behavior: Behavior normal.        Thought Content: Thought content normal.        Judgment: Judgment normal.      Assessment:  [name] is a 66 y.o. male  who presents today for Colonoscopy for surveillance- phx colon polyps.  Plan:  Colonoscopy with possible intervention today  Colonoscopy with possible biopsy, control of bleeding, polypectomy, and interventions as necessary has been discussed with the patient/patient representative. Informed consent was obtained from the patient/patient representative after explaining the indication, nature, and risks of the procedure including but not limited to death, bleeding, perforation, missed neoplasm/lesions, cardiorespiratory compromise, and reaction to medications. Opportunity for questions was given and appropriate answers were provided.  Patient/patient representative has verbalized understanding is amenable to undergoing the procedure.   Annamaria Helling, DO  [facility]  Portions of the record may have been created with voice recognition software. Occasional wrong-word or 'sound-a-like' substitutions may have occurred due to the inherent limitations of voice recognition software.  Read the chart carefully and recognize, using context, where substitutions may have occurred.

## 2022-04-07 ENCOUNTER — Encounter
Admission: RE | Disposition: A | Discharge status: Home / Self Care | Payer: Self-pay | Source: Home / Self Care | Attending: Gastroenterology

## 2022-04-07 ENCOUNTER — Ambulatory Visit: Payer: Medicare Other | Admitting: Anesthesiology

## 2022-04-07 ENCOUNTER — Encounter: Payer: Self-pay | Admitting: Gastroenterology

## 2022-04-07 ENCOUNTER — Ambulatory Visit
Admission: RE | Admit: 2022-04-07 | Discharge: 2022-04-07 | Disposition: A | Discharge status: Home / Self Care | Payer: Medicare Other | Attending: Gastroenterology | Admitting: Gastroenterology

## 2022-04-07 DIAGNOSIS — Z1211 Encounter for screening for malignant neoplasm of colon: Secondary | ICD-10-CM | POA: Diagnosis not present

## 2022-04-07 DIAGNOSIS — D122 Benign neoplasm of ascending colon: Secondary | ICD-10-CM | POA: Diagnosis not present

## 2022-04-07 DIAGNOSIS — E785 Hyperlipidemia, unspecified: Secondary | ICD-10-CM | POA: Diagnosis not present

## 2022-04-07 DIAGNOSIS — K640 First degree hemorrhoids: Secondary | ICD-10-CM | POA: Diagnosis not present

## 2022-04-07 DIAGNOSIS — G4730 Sleep apnea, unspecified: Secondary | ICD-10-CM | POA: Insufficient documentation

## 2022-04-07 DIAGNOSIS — K5730 Diverticulosis of large intestine without perforation or abscess without bleeding: Secondary | ICD-10-CM | POA: Diagnosis not present

## 2022-04-07 DIAGNOSIS — D123 Benign neoplasm of transverse colon: Secondary | ICD-10-CM | POA: Diagnosis not present

## 2022-04-07 DIAGNOSIS — F419 Anxiety disorder, unspecified: Secondary | ICD-10-CM | POA: Diagnosis not present

## 2022-04-07 DIAGNOSIS — J449 Chronic obstructive pulmonary disease, unspecified: Secondary | ICD-10-CM | POA: Insufficient documentation

## 2022-04-07 DIAGNOSIS — F32A Depression, unspecified: Secondary | ICD-10-CM | POA: Insufficient documentation

## 2022-04-07 DIAGNOSIS — Z87891 Personal history of nicotine dependence: Secondary | ICD-10-CM | POA: Insufficient documentation

## 2022-04-07 DIAGNOSIS — K635 Polyp of colon: Secondary | ICD-10-CM | POA: Diagnosis not present

## 2022-04-07 DIAGNOSIS — Z9049 Acquired absence of other specified parts of digestive tract: Secondary | ICD-10-CM | POA: Diagnosis not present

## 2022-04-07 DIAGNOSIS — Z86010 Personal history of colonic polyps: Secondary | ICD-10-CM | POA: Insufficient documentation

## 2022-04-07 DIAGNOSIS — Z96642 Presence of left artificial hip joint: Secondary | ICD-10-CM | POA: Diagnosis not present

## 2022-04-07 DIAGNOSIS — Z83719 Family history of colon polyps, unspecified: Secondary | ICD-10-CM | POA: Insufficient documentation

## 2022-04-07 DIAGNOSIS — E039 Hypothyroidism, unspecified: Secondary | ICD-10-CM | POA: Insufficient documentation

## 2022-04-07 DIAGNOSIS — I10 Essential (primary) hypertension: Secondary | ICD-10-CM | POA: Insufficient documentation

## 2022-04-07 DIAGNOSIS — Z85828 Personal history of other malignant neoplasm of skin: Secondary | ICD-10-CM | POA: Insufficient documentation

## 2022-04-07 DIAGNOSIS — Z79899 Other long term (current) drug therapy: Secondary | ICD-10-CM | POA: Diagnosis not present

## 2022-04-07 HISTORY — PX: COLONOSCOPY: SHX5424

## 2022-04-07 SURGERY — COLONOSCOPY
Anesthesia: General

## 2022-04-07 MED ORDER — PROPOFOL 10 MG/ML IV BOLUS
INTRAVENOUS | Status: DC | PRN
  Administered 2022-04-07: 90 mg via INTRAVENOUS

## 2022-04-07 MED ORDER — PROPOFOL 500 MG/50ML IV EMUL
INTRAVENOUS | Status: DC | PRN
  Administered 2022-04-07: 150 ug/kg/min via INTRAVENOUS

## 2022-04-07 MED ORDER — SODIUM CHLORIDE 0.9 % IV SOLN: INTRAVENOUS | Status: DC

## 2022-04-07 MED ORDER — LIDOCAINE HCL (PF) 2 % IJ SOLN
INTRAMUSCULAR | Status: AC
  Filled 2022-04-07: qty 5

## 2022-04-07 MED ORDER — PROPOFOL 1000 MG/100ML IV EMUL
INTRAVENOUS | Status: AC
  Filled 2022-04-07: qty 100

## 2022-04-07 MED ORDER — LIDOCAINE HCL (CARDIAC) PF 100 MG/5ML IV SOSY
PREFILLED_SYRINGE | INTRAVENOUS | Status: DC | PRN
  Administered 2022-04-07: 40 mg via INTRAVENOUS

## 2022-04-07 NOTE — Anesthesia Postprocedure Evaluation (Signed)
Anesthesia Post Note  Patient: Frank Golden  Procedure(s) Performed: COLONOSCOPY  Patient location during evaluation: PACU Anesthesia Type: General Level of consciousness: awake and alert Pain management: pain level controlled Vital Signs Assessment: post-procedure vital signs reviewed and stable Respiratory status: spontaneous breathing, nonlabored ventilation, respiratory function stable and patient connected to nasal cannula oxygen Cardiovascular status: blood pressure returned to baseline and stable Postop Assessment: no apparent nausea or vomiting Anesthetic complications: no   No notable events documented.   Last Vitals:  Vitals:   04/07/22 0722 04/07/22 0831  BP: (!) 133/103 115/86  Pulse: 88 84  Resp: 20 19  Temp: (!) 35.7 C (!) 36.1 C  SpO2: 98% 99%    Last Pain:  Vitals:   04/07/22 0831  TempSrc: Temporal  PainSc: [place] [facility] Shantal Roan

## 2022-04-07 NOTE — Anesthesia Preprocedure Evaluation (Signed)
Anesthesia Evaluation  Patient identified by MRN, date of birth, ID band Patient awake    Reviewed: Allergy & Precautions, H&P , NPO status , Patient's Chart, lab work & pertinent test results, reviewed documented beta blocker date and time   Airway Mallampati: II   Neck ROM: full    Dental  (+) Poor Dentition   Pulmonary neg pulmonary ROS, asthma , sleep apnea , COPD, former smoker,    Pulmonary exam normal        Cardiovascular Exercise Tolerance: Poor hypertension, On Medications negative cardio ROS Normal cardiovascular exam Rhythm:regular Rate:Normal     Neuro/Psych PSYCHIATRIC DISORDERS Anxiety Depression negative neurological ROS  negative psych ROS   GI/Hepatic negative GI ROS, Neg liver ROS,   Endo/Other  negative endocrine ROSHypothyroidism   Renal/GU negative Renal ROS  negative genitourinary   Musculoskeletal   Abdominal   Peds  Hematology negative hematology ROS (+)   Anesthesia Other Findings Past Medical History: No date: Allergic state No date: Asthma No date: Cancer [facility])     Comment:  skin cancer on face No date: Depression No date: DJD (degenerative joint disease) No date: ED (erectile dysfunction) No date: History of chicken pox No date: History of pancreatitis No date: Hyperlipidemia No date: Hypertension No date: Hypertension No date: Hypothyroid No date: OCD (obsessive compulsive disorder) No date: Psoriasis No date: Rheumatoid arthritis ([facility]) No date: Sleep apnea     Comment:  uses CPAP Past Surgical History: No date: APPENDECTOMY No date: CYSTECTOMY No date: endoscopic carpal tunnell No date: LAMINECTOMY No date: MOHS SURGERY     Comment:  basal cell on nose 12/28/2021: TOTAL HIP ARTHROPLASTY; Left     Comment:  Procedure: TOTAL HIP ARTHROPLASTY ANTERIOR APPROACH;                Surgeon: Hessie Knows, MD;  Location: [facility] ORS;                Service: Orthopedics;   Laterality: Left;   Reproductive/Obstetrics negative OB ROS                             Anesthesia Physical Anesthesia Plan  ASA: 3  Anesthesia Plan: General   Post-op Pain Management:    Induction:   PONV Risk Score and Plan:   Airway Management Planned:   Additional Equipment:   Intra-op Plan:   Post-operative Plan:   Informed Consent: I have reviewed the patients History and Physical, chart, labs and discussed the procedure including the risks, benefits and alternatives for the proposed anesthesia with the patient or authorized representative who has indicated his/her understanding and acceptance.     Dental Advisory Given  Plan Discussed with: CRNA  Anesthesia Plan Comments:         Anesthesia Quick Evaluation

## 2022-04-07 NOTE — Transfer of Care (Signed)
Immediate Anesthesia Transfer of Care Note  Patient: Danzig Macgregor  Procedure(s) Performed: Procedure(s): COLONOSCOPY (N/A)  Patient Location: PACU and Endoscopy Unit  Anesthesia Type:General  Level of Consciousness: sedated  Airway & Oxygen Therapy: Patient Spontanous Breathing and Patient connected to nasal cannula oxygen  Post-op Assessment: Report given to RN and Post -op Vital signs reviewed and stable  Post vital signs: Reviewed and stable  Last Vitals:  Vitals:   04/07/22 0722 04/07/22 0831  BP: (!) 133/103 115/86  Pulse: 88 84  Resp: 20 19  Temp: (!) 35.7 C (!) 36.1 C  SpO2: 31% 43%    Complications: No apparent anesthesia complications

## 2022-04-07 NOTE — Anesthesia Procedure Notes (Signed)
Date/Time: 04/07/2022 7:47 AM  Performed by: Doreen Salvage, CRNAPre-anesthesia Checklist: Patient identified, Emergency Drugs available, Suction available and Patient being monitored Patient Re-evaluated:Patient Re-evaluated prior to induction Oxygen Delivery Method: Nasal cannula Induction Type: IV induction Dental Injury: Teeth and Oropharynx as per pre-operative assessment  Comments: Nasal cannula with etCO2 monitoring

## 2022-04-07 NOTE — Interval H&P Note (Signed)
History and Physical Interval Note: Preprocedure H&P from 04/07/22  was reviewed and there was no interval change after seeing and examining the patient.  Written consent was obtained from the patient after discussion of risks, benefits, and alternatives. Patient has consented to proceed with Colonoscopy with possible intervention   04/07/2022 8:48 AM  Frank Golden  has presented today for surgery, with the diagnosis of History of adenomatous polyp of colon (Z86.010).  The various methods of treatment have been discussed with the patient and family. After consideration of risks, benefits and other options for treatment, the patient has consented to  Procedure(s): COLONOSCOPY (N/A) as a surgical intervention.  The patient's history has been reviewed, patient examined, no change in status, stable for surgery.  I have reviewed the patient's chart and labs.  Questions were answered to the patient's satisfaction.     Annamaria Helling

## 2022-04-07 NOTE — Op Note (Signed)
[facility] Patient Name: Frank Golden Procedure Date: 04/07/2022 7:43 AM MRN: [id number] Account #: [id number] Date of Birth: 1955/08/08 Admit Type: Outpatient Age: 66 Room: [facility] ENDO ROOM 1 Gender: Male Note Status: Finalized Instrument Name: Colonoscope [id number] Procedure:             Colonoscopy Indications:           High risk colon cancer surveillance: Personal history                         of colonic polyps Providers:             Rueben Bash, DO Referring MD:          Ramonita Lab, MD (Referring MD) Medicines:             Monitored Anesthesia Care Complications:         No immediate complications. Estimated blood loss:                         Minimal. Procedure:             Pre-Anesthesia Assessment:                        - Prior to the procedure, a History and Physical was                         performed, and patient medications and allergies were                         reviewed. The patient is competent. The risks and                         benefits of the procedure and the sedation options and                         risks were discussed with the patient. All questions                         were answered and informed consent was obtained.                         Patient identification and proposed procedure were                         verified by the physician, the nurse, the anesthetist                         and the technician in the endoscopy suite. Mental                         Status Examination: alert and oriented. Airway                         Examination: normal oropharyngeal airway and neck                         mobility. Respiratory Examination: clear to  auscultation. CV Examination: RRR, no murmurs, no S3                         or S4. Prophylactic Antibiotics: The patient does not                         require prophylactic antibiotics. Prior                          Anticoagulants: The patient has taken no anticoagulant                         or antiplatelet agents. ASA Grade Assessment: III - A                         patient with severe systemic disease. After reviewing                         the risks and benefits, the patient was deemed in                         satisfactory condition to undergo the procedure. The                         anesthesia plan was to use monitored anesthesia care                         (MAC). Immediately prior to administration of                         medications, the patient was re-assessed for adequacy                         to receive sedatives. The heart rate, respiratory                         rate, oxygen saturations, blood pressure, adequacy of                         pulmonary ventilation, and response to care were                         monitored throughout the procedure. The physical                         status of the patient was re-assessed after the                         procedure.                        After obtaining informed consent, the colonoscope was                         passed under direct vision. Throughout the procedure,                         the patient's blood pressure, pulse, and oxygen  saturations were monitored continuously. The                         Colonoscope was introduced through the anus and                         advanced to the the terminal ileum, with                         identification of the appendiceal orifice and IC                         valve. The colonoscopy was performed without                         difficulty. The patient tolerated the procedure well.                         The quality of the bowel preparation was evaluated                         using the BBPS [facility] Bowel Preparation Scale) with                         scores of: Right Colon = 2 (minor amount of residual                         staining, small fragments  of stool and/or opaque                         liquid, but mucosa seen well), Transverse Colon = 3                         (entire mucosa seen well with no residual staining,                         small fragments of stool or opaque liquid) and Left                         Colon = 2 (minor amount of residual staining, small                         fragments of stool and/or opaque liquid, but mucosa                         seen well). The total BBPS score equals 7. The quality                         of the bowel preparation was good. The terminal ileum,                         ileocecal valve, appendiceal orifice, and rectum were                         photographed. Findings:      Hemorrhoids were found on perianal exam.      The digital rectal exam was normal. Pertinent negatives  include normal       sphincter tone.      The terminal ileum appeared normal. Estimated blood loss: none.      Non-bleeding internal hemorrhoids were found during retroflexion. The       hemorrhoids were Grade I (internal hemorrhoids that do not prolapse).       Estimated blood loss: none.      Multiple small-mouthed diverticula were found in the entire colon.       Estimated blood loss: none.      Five sessile polyps were found in the transverse colon (4) and ascending       colon. The polyps were 3 to 6 mm in size. These polyps were removed with       a cold snare. Resection and retrieval were complete. Estimated blood       loss was minimal.      Two sessile polyps were found in the transverse colon and cecum. The       polyps were 1 to 2 mm in size. These polyps were removed with a jumbo       cold forceps. Resection and retrieval were complete. Estimated blood       loss was minimal.      The exam was otherwise without abnormality on direct and retroflexion       views. Impression:            - Hemorrhoids found on perianal exam.                        - The examined portion of the ileum was normal.                         - Non-bleeding internal hemorrhoids.                        - Diverticulosis in the entire examined colon.                        - Five 3 to 6 mm polyps in the transverse colon and in                         the ascending colon, removed with a cold snare.                         Resected and retrieved.                        - Two 1 to 2 mm polyps in the transverse colon and in                         the cecum, removed with a jumbo cold forceps. Resected                         and retrieved.                        - The examination was otherwise normal on direct and                         retroflexion views. Recommendation:        - Patient  has a contact number available for                         emergencies. The signs and symptoms of potential                         delayed complications were discussed with the patient.                         Return to normal activities tomorrow. Written                         discharge instructions were provided to the patient.                        - Discharge patient to home.                        - Resume previous diet.                        - Continue present medications.                        - No aspirin, ibuprofen, naproxen, or other                         non-steroidal anti-inflammatory drugs for 5 days after                         polyp removal.                        - Await pathology results.                        - Repeat colonoscopy for surveillance based on                         pathology results.                        - Return to referring physician as previously                         scheduled.                        - The findings and recommendations were discussed with                         the patient. Procedure Code(s):     --- Professional ---                        [phone], Colonoscopy, flexible; with removal of                         tumor(s), polyp(s), or other lesion(s) by snare                          technique  52080, 25, Colonoscopy, flexible; with biopsy, single                         or multiple Diagnosis Code(s):     --- Professional ---                        Z86.010, Personal history of colonic polyps                        K64.0, First degree hemorrhoids                        D12.2, Benign neoplasm of ascending colon                        D12.3, Benign neoplasm of transverse colon (hepatic                         flexure or splenic flexure)                        D12.0, Benign neoplasm of cecum                        K57.30, Diverticulosis of large intestine without                         perforation or abscess without bleeding CPT copyright 2022 American [employer] Association. All rights reserved. The codes documented in this report are preliminary and upon coder review may  be revised to meet current compliance requirements. Attending Participation:      I personally performed the entire procedure. Volney American, DO Annamaria Helling DO, DO 04/07/2022 8:39:10 AM This report has been signed electronically. Number of Addenda: 0 Note Initiated On: 04/07/2022 7:43 AM Scope Withdrawal Time: 0 hours 25 minutes 1 second  Total Procedure Duration: 0 hours 35 minutes 39 seconds  Estimated Blood Loss:  Estimated blood loss was minimal.      [facility]

## 2022-04-08 ENCOUNTER — Encounter: Payer: Self-pay | Admitting: Gastroenterology

## 2022-04-08 LAB — SURGICAL PATHOLOGY

## 2022-05-12 ENCOUNTER — Other Ambulatory Visit: Payer: Self-pay | Admitting: Orthopedic Surgery

## 2022-05-12 DIAGNOSIS — Z96642 Presence of left artificial hip joint: Secondary | ICD-10-CM

## 2022-05-12 DIAGNOSIS — M2170 Unequal limb length (acquired), unspecified site: Secondary | ICD-10-CM

## 2022-05-18 ENCOUNTER — Ambulatory Visit
Admission: RE | Admit: 2022-05-18 | Discharge: 2022-05-18 | Disposition: A | Discharge status: Home / Self Care | Payer: Medicare Other | Source: Ambulatory Visit | Attending: Orthopedic Surgery | Admitting: Orthopedic Surgery

## 2022-05-18 DIAGNOSIS — M2170 Unequal limb length (acquired), unspecified site: Secondary | ICD-10-CM

## 2022-05-18 DIAGNOSIS — Z96642 Presence of left artificial hip joint: Secondary | ICD-10-CM

## 2022-07-11 ENCOUNTER — Ambulatory Visit: Payer: Medicare Other | Admitting: Urology

## 2022-07-11 VITALS — BP 133/89 | HR 98 | Ht 67.0 in | Wt 236.5 lb

## 2022-07-11 DIAGNOSIS — N3941 Urge incontinence: Secondary | ICD-10-CM

## 2022-07-11 LAB — BLADDER SCAN AMB NON-IMAGING

## 2022-07-11 NOTE — Progress Notes (Signed)
07/11/2022 1:20 PM   Frank Golden February 23, 1956 [id number]  Referring provider: Adin Hector, MD [facility] [place],  [place] [address]  Chief Complaint  Patient presents with   Establish Care   Urinary Incontinence    HPI: I was consulted to assess the patient's urgency incontinence that has had more than 1 year.  He wears 1 or 2 pads a day but up to 5-6 pads a day that can be quite wet.  He denies stress incontinence and bedwetting when he goes from sitting to standing position he feels the need to go.  He voids every hour and cannot hold it for 2 hours.  He has no nocturia  Flow can vary from poor to good.  30 minutes later he can double void a mild to moderate amount.  He thinks he feels empty most times  He has had low back surgery more than 5 years ago and gets injections.  He quit smoking years ago  I reviewed the medical records and he has some medical comorbidities with mild cognitive impairment and some anxiety.  No history of kidney stones bladder surgery or bladder infections.  Bowel movements normal.  No treatment   PMH: Past Medical History:  Diagnosis Date   Allergic state    Asthma    Cancer ([place])    skin cancer on face   Depression    DJD (degenerative joint disease)    ED (erectile dysfunction)    History of chicken pox    History of pancreatitis    Hyperlipidemia    Hypertension    Hypertension    Hypothyroid    OCD (obsessive compulsive disorder)    Psoriasis    Rheumatoid arthritis ([place])    Sleep apnea    uses CPAP    Surgical History: Past Surgical History:  Procedure Laterality Date   APPENDECTOMY     COLONOSCOPY N/A 04/07/2022   Procedure: COLONOSCOPY;  Surgeon: Annamaria Helling, DO;  Location[facility] ENDOSCOPY;  Service: Gastroenterology;  Laterality: N/A;   CYSTECTOMY     endoscopic carpal tunnell     LAMINECTOMY     MOHS SURGERY     basal cell on nose   TOTAL HIP ARTHROPLASTY Left 12/28/2021    Procedure: TOTAL HIP ARTHROPLASTY ANTERIOR APPROACH;  Surgeon: Hessie Knows, MD;  Location: [facility] ORS;  Service: Orthopedics;  Laterality: Left;    Home Medications:  Allergies as of 07/11/2022       Reactions   Bee Venom Anaphylaxis   Gabapentin Other (See Comments)   Felt like jumping out of skin   Covid-19 (mrna) Vaccine Nausea And Vomiting   Vomiting x 2   Quinapril Hcl Cough   Remeron [mirtazapine] Other (See Comments)   NUMBNESS OF FACE        Medication List        Accurate as of July 11, 2022  1:20 PM. If you have any questions, ask your nurse or doctor.          STOP taking these medications    enoxaparin 40 MG/0.4ML injection Commonly known as: LOVENOX   HYDROcodone-acetaminophen 7.5-325 MG tablet Commonly known as: NORCO   traMADol 50 MG tablet Commonly known as: ULTRAM       TAKE these medications    acetaminophen 500 MG tablet Commonly known as: TYLENOL Take 1,000 mg by mouth 3 (three) times daily as needed.   albuterol 108 (90 Base) MCG/ACT inhaler Commonly known as:  VENTOLIN HFA Inhale 2 puffs into the lungs as needed. What changed: Another medication with the same name was removed. Continue taking this medication, and follow the directions you see here.   amLODipine 5 MG tablet Commonly known as: NORVASC Take 5 mg by mouth daily.   B-Complex/B-12 Tabs Take 1 tablet by mouth 2 (two) times daily.   budesonide-formoterol 160-4.5 MCG/ACT inhaler Commonly known as: SYMBICORT Inhale 2 puffs into the lungs 2 (two) times daily. What changed:  how much to take when to take this   busPIRone 30 MG tablet Commonly known as: BUSPAR Take 30 mg by mouth 2 (two) times daily.   cetirizine 10 MG tablet Commonly known as: ZYRTEC Take 10 mg by mouth daily.   Coenzyme Q-10 100 MG capsule Take 100 mg by mouth daily.   EPINEPHrine 0.3 mg/0.3 mL Soaj injection Commonly known as: EPI-PEN Inject 0.3 mg into the muscle as needed.   fluticasone  50 MCG/ACT nasal spray Commonly known as: FLONASE Place 2 sprays into the nose daily.   hydrochlorothiazide 25 MG tablet Commonly known as: HYDRODIURIL Take 25 mg by mouth daily.   Levothyroxine Sodium 125 MCG Caps Take 125 mcg by mouth daily before breakfast.   MULTIVITAMIN ADULT PO Take 1 tablet by mouth daily.   nortriptyline 50 MG capsule Commonly known as: PAMELOR Take 50 mg by mouth at bedtime.   Polyethyl Glycol-Propyl Glycol 0.4-0.3 % Soln Apply 1 drop to eye 2 (two) times daily as needed.   pravastatin 20 MG tablet Commonly known as: PRAVACHOL Take 20 mg by mouth at bedtime.   QUEtiapine 50 MG tablet Commonly known as: SEROQUEL Take 50 mg by mouth at bedtime.   saw palmetto 160 MG capsule Take 1 capsule by mouth daily.   tiZANidine 4 MG capsule Commonly known as: ZANAFLEX Take 4 mg by mouth 3 (three) times daily as needed for muscle spasms.   TURMERIC PO Take 1 capsule by mouth daily.   valsartan 320 MG tablet Commonly known as: DIOVAN Take 320 mg by mouth daily.        Allergies:  Allergies  Allergen Reactions   Bee Venom Anaphylaxis   Gabapentin Other (See Comments)    Felt like jumping out of skin   Covid-19 (Mrna) Vaccine Nausea And Vomiting    Vomiting x 2   Quinapril Hcl Cough   Remeron [Mirtazapine] Other (See Comments)    NUMBNESS OF FACE    Family History: Family History  Problem Relation Age of Onset   COPD Mother    Arthritis Mother    Osteoporosis Mother    Anxiety disorder Mother    Depression Mother    Stroke Father    Cancer Father        penile   Heart failure Father    Aneurysm Father    Hypertension Father    Coronary artery disease Father    Anxiety disorder Maternal Grandmother    Depression Maternal Grandmother     Social History:  reports that he quit smoking about 44 years ago. His smoking use included cigarettes. He has a 15.00 pack-year smoking history. He has never used smokeless tobacco. He reports  current alcohol use of about 3.0 standard drinks of alcohol per week. He reports that he does not use drugs.  ROS:  Physical Exam: There were no vitals taken for this visit.  Constitutional:  Alert and oriented, No acute distress. HEENT: [place] AT, moist mucus membranes.  Trachea midline, no masses. Cardiovascular: No clubbing, cyanosis, or edema. Respiratory: Normal respiratory effort, no increased work of breathing. GI: Abdomen is soft, nontender, nondistended, no abdominal masses GU: Male genitalia normal.  50 g benign prostate Skin: No rashes, bruises or suspicious lesions. Lymph: No cervical or inguinal adenopathy. Neurologic: Grossly intact, no focal deficits, moving all 4 extremities. Psychiatric: Normal mood and affect.  Laboratory Data: Lab Results  Component Value Date   WBC 13.3 (H) 12/29/2021   HGB 12.5 (L) 12/29/2021   HCT 36.7 (L) 12/29/2021   MCV 88.0 12/29/2021   PLT 263 12/29/2021    Lab Results  Component Value Date   CREATININE 0.95 12/29/2021    No results found for: "PSA"  No results found for: "TESTOSTERONE"  Lab Results  Component Value Date   HGBA1C 6.1 12/25/2012    Urinalysis    Component Value Date/Time   COLORURINE YELLOW (A) 12/17/2021 1158   APPEARANCEUR HAZY (A) 12/17/2021 1158   APPEARANCEUR CLEAR 12/24/2012 2305   LABSPEC 1.027 12/17/2021 1158   LABSPEC 1.010 12/24/2012 2305   PHURINE 5.0 12/17/2021 1158   GLUCOSEU NEGATIVE 12/17/2021 1158   GLUCOSEU NEGATIVE 12/24/2012 2305   HGBUR NEGATIVE 12/17/2021 1158   BILIRUBINUR NEGATIVE 12/17/2021 1158   BILIRUBINUR NEGATIVE 12/24/2012 2305   KETONESUR 5 (A) 12/17/2021 1158   PROTEINUR NEGATIVE 12/17/2021 1158   NITRITE NEGATIVE 12/17/2021 1158   LEUKOCYTESUR NEGATIVE 12/17/2021 1158   LEUKOCYTESUR NEGATIVE 12/24/2012 2305    Pertinent Imaging: Urine reviewed.  Urine sent for culture.  Chart reviewed  Assessment &  Plan: Patient has urgency incontinence and at times it is quite severe.  This is a presentation of the ordinary.  He has a frequent bladder and his flow varies.  His postvoid residual today was 46 mL . he will return for urodynamics and cystoscopy and we will proceed accordingly.  Call if urine culture positive  1. Urge incontinence of urine  - Urinalysis, Complete   No follow-ups on file.  Reece Packer, MD  [facility] [address] [place], [place] [address] [phone]

## 2022-07-11 NOTE — Addendum Note (Signed)
Addended by: Donalee Citrin on: 07/11/2022 01:45 PM   Modules accepted: Orders

## 2022-07-11 NOTE — Patient Instructions (Signed)

## 2022-07-12 LAB — MICROSCOPIC EXAMINATION

## 2022-07-12 LAB — URINALYSIS, COMPLETE
Bilirubin, UA: NEGATIVE
Glucose, UA: NEGATIVE
Ketones, UA: NEGATIVE
Leukocytes,UA: NEGATIVE
Nitrite, UA: NEGATIVE
Protein,UA: NEGATIVE
RBC, UA: NEGATIVE
Specific Gravity, UA: 1.02 (ref 1.005–1.030)
Urobilinogen, Ur: 0.2 mg/dL (ref 0.2–1.0)
pH, UA: 5.5 (ref 5.0–7.5)

## 2022-07-14 LAB — CULTURE, URINE COMPREHENSIVE

## 2022-07-22 ENCOUNTER — Telehealth: Payer: Self-pay | Admitting: Urology

## 2022-07-22 NOTE — Telephone Encounter (Signed)
Pt is returning Dee's phone call.  [phone]

## 2022-08-15 ENCOUNTER — Other Ambulatory Visit: Payer: Medicare Other | Admitting: Urology

## 2022-08-22 ENCOUNTER — Encounter: Payer: Self-pay | Admitting: Urology

## 2022-08-22 ENCOUNTER — Ambulatory Visit: Payer: Medicare Other | Admitting: Urology

## 2022-08-22 VITALS — BP 128/85 | HR 101 | Ht 67.0 in | Wt 242.4 lb

## 2022-08-22 DIAGNOSIS — N3941 Urge incontinence: Secondary | ICD-10-CM | POA: Diagnosis not present

## 2022-08-22 MED ORDER — TAMSULOSIN HCL 0.4 MG PO CAPS: 0.4000 mg | ORAL_CAPSULE | Freq: Every day | ORAL | 11 refills | Status: DC

## 2022-08-22 NOTE — Progress Notes (Signed)
08/22/2022 3:34 PM   Frank Golden 1956/05/23 [id number]  Referring provider: Adin Hector, MD [facility] [place],  [place] [address]  Chief Complaint  Patient presents with   Cysto    HPI: I was consulted to assess the patient's urgency incontinence that has had more than 1 year.  He wears 1 or 2 pads a day but up to 5-6 pads a day that can be quite wet.  He denies stress incontinence and bedwetting when he goes from sitting to standing position he feels the need to go.  He voids every hour and cannot hold it for 2 hours.  He has no nocturia   Flow can vary from poor to good.  30 minutes later he can double void a mild to moderate amount.  He thinks he feels empty most times   He has had low back surgery more than 5 years ago and gets injections.  He quit smoking years ago   I reviewed the medical records and he has some medical comorbidities with mild cognitive impairment and some anxiety.    50 g benign prostate  Patient has urgency incontinence and at times it is quite severe.  This is a presentation of the ordinary.  He has a frequent bladder and his flow varies.  His postvoid residual today was 46 mL .   Today Frequency stable.  Last culture negative On urodynamics patient did not void was catheterized for 100 mL.  Maximum bladder capacity was 388 mL.  Patient had high pressure bladder overactivity reaching pressure 107 cm of water.  He leaked a small amount.  No stress incontinence with Valsalva pressure of 128 cmH2O.  He was able to generate a voluntary contraction.  Since at least he started to void sitting down but had to stand or to empty his bladder well.  He voided 109 mL with a maximal flow 6 mL/s.  Interrupted flow pattern.  Max voiding pressure 69 cm water.  Residual was 279 mL.  EMG activity decreased during voiding.  He had a little bit of a elongated bladder with trabeculations.  Cystoscopy: Patient underwent flexible cystoscopy  utilizing sterile technique.  Penile bulbar urethra normal.  He had mild bilobar enlargement the prostate.  No high riding bladder neck.  Bladder mucosa and trigone were normal.  No cystitis.  No carcinoma.  Overall very well-tolerated        PMH: Past Medical History:  Diagnosis Date   Allergic state    Asthma    Cancer ([place])    skin cancer on face   Depression    DJD (degenerative joint disease)    ED (erectile dysfunction)    History of chicken pox    History of pancreatitis    Hyperlipidemia    Hypertension    Hypertension    Hypothyroid    OCD (obsessive compulsive disorder)    Psoriasis    Rheumatoid arthritis ([place])    Sleep apnea    uses CPAP    Surgical History: Past Surgical History:  Procedure Laterality Date   APPENDECTOMY     COLONOSCOPY N/A 04/07/2022   Procedure: COLONOSCOPY;  Surgeon: Annamaria Helling, DO;  Location: [facility] ENDOSCOPY;  Service: Gastroenterology;  Laterality: N/A;   CYSTECTOMY     endoscopic carpal tunnell     LAMINECTOMY     MOHS SURGERY     basal cell on nose   TOTAL HIP ARTHROPLASTY Left 12/28/2021   Procedure: TOTAL  HIP ARTHROPLASTY ANTERIOR APPROACH;  Surgeon: Hessie Knows, MD;  Location: [facility] ORS;  Service: Orthopedics;  Laterality: Left;    Home Medications:  Allergies as of 08/22/2022       Reactions   Bee Venom Anaphylaxis   Gabapentin Other (See Comments)   Felt like jumping out of skin   Covid-19 (mrna) Vaccine Nausea And Vomiting   Vomiting x 2   Quinapril Hcl Cough   Remeron [mirtazapine] Other (See Comments)   NUMBNESS OF FACE        Medication List        Accurate as of August 22, 2022  3:34 PM. If you have any questions, ask your nurse or doctor.          acetaminophen 500 MG tablet Commonly known as: TYLENOL Take 1,000 mg by mouth 3 (three) times daily as needed.   albuterol 108 (90 Base) MCG/ACT inhaler Commonly known as: VENTOLIN HFA Inhale 2 puffs into the lungs as needed.   amLODipine  5 MG tablet Commonly known as: NORVASC Take 5 mg by mouth daily.   B-Complex/B-12 Tabs Take 1 tablet by mouth 2 (two) times daily.   budesonide-formoterol 160-4.5 MCG/ACT inhaler Commonly known as: SYMBICORT Inhale 2 puffs into the lungs 2 (two) times daily.   busPIRone 30 MG tablet Commonly known as: BUSPAR Take 30 mg by mouth 2 (two) times daily.   cetirizine 10 MG tablet Commonly known as: ZYRTEC Take 10 mg by mouth daily.   Coenzyme Q-10 100 MG capsule Take 100 mg by mouth daily.   EPINEPHrine 0.3 mg/0.3 mL Soaj injection Commonly known as: EPI-PEN Inject 0.3 mg into the muscle as needed.   fluticasone 50 MCG/ACT nasal spray Commonly known as: FLONASE Place 2 sprays into the nose daily.   hydrochlorothiazide 25 MG tablet Commonly known as: HYDRODIURIL Take 25 mg by mouth daily.   Levothyroxine Sodium 125 MCG Caps Take 125 mcg by mouth daily before breakfast.   MULTIVITAMIN ADULT PO Take 1 tablet by mouth daily.   nortriptyline 75 MG capsule Commonly known as: PAMELOR Take 75 mg by mouth. What changed: Another medication with the same name was removed. Continue taking this medication, and follow the directions you see here. Changed by: Reece Packer, MD   Polyethyl Glycol-Propyl Glycol 0.4-0.3 % Soln Apply 1 drop to eye 2 (two) times daily as needed.   pravastatin 20 MG tablet Commonly known as: PRAVACHOL Take 20 mg by mouth at bedtime.   QUEtiapine 50 MG tablet Commonly known as: SEROQUEL Take 50 mg by mouth at bedtime.   saw palmetto 160 MG capsule Take 1 capsule by mouth daily.   tiZANidine 4 MG capsule Commonly known as: ZANAFLEX Take 4 mg by mouth 3 (three) times daily as needed for muscle spasms.   TURMERIC PO Take 1 capsule by mouth daily.   valsartan 320 MG tablet Commonly known as: DIOVAN Take 320 mg by mouth daily.        Allergies:  Allergies  Allergen Reactions   Bee Venom Anaphylaxis   Gabapentin Other (See Comments)     Felt like jumping out of skin   Covid-19 (Mrna) Vaccine Nausea And Vomiting    Vomiting x 2   Quinapril Hcl Cough   Remeron [Mirtazapine] Other (See Comments)    NUMBNESS OF FACE    Family History: Family History  Problem Relation Age of Onset   COPD Mother    Arthritis Mother    Osteoporosis Mother  Anxiety disorder Mother    Depression Mother    Stroke Father    Cancer Father        penile   Heart failure Father    Aneurysm Father    Hypertension Father    Coronary artery disease Father    Anxiety disorder Maternal Grandmother    Depression Maternal Grandmother     Social History:  reports that he quit smoking about 44 years ago. His smoking use included cigarettes. He has a 15.00 pack-year smoking history. He has never used smokeless tobacco. He reports current alcohol use of about 3.0 standard drinks of alcohol per week. He reports that he does not use drugs.  ROS:                                        Physical Exam: BP 128/85   Pulse (!) 101   Ht '5\' 7"'$  (1.702 m)   Wt 110 kg   BMI 37.97 kg/m   Constitutional:  Alert and oriented, No acute distress. HEENT: [place] AT, moist mucus membranes.  Trachea midline, no masses.   Laboratory Data: Lab Results  Component Value Date   WBC 13.3 (H) 12/29/2021   HGB 12.5 (L) 12/29/2021   HCT 36.7 (L) 12/29/2021   MCV 88.0 12/29/2021   PLT 263 12/29/2021    Lab Results  Component Value Date   CREATININE 0.95 12/29/2021    No results found for: "PSA"  No results found for: "TESTOSTERONE"  Lab Results  Component Value Date   HGBA1C 6.1 12/25/2012    Urinalysis    Component Value Date/Time   COLORURINE YELLOW (A) 12/17/2021 1158   APPEARANCEUR Clear 07/11/2022 1315   LABSPEC 1.027 12/17/2021 1158   LABSPEC 1.010 12/24/2012 2305   PHURINE 5.0 12/17/2021 1158   GLUCOSEU Negative 07/11/2022 1315   GLUCOSEU NEGATIVE 12/24/2012 2305   HGBUR NEGATIVE 12/17/2021 1158   BILIRUBINUR  Negative 07/11/2022 1315   BILIRUBINUR NEGATIVE 12/24/2012 2305   KETONESUR 5 (A) 12/17/2021 1158   PROTEINUR Negative 07/11/2022 1315   PROTEINUR NEGATIVE 12/17/2021 1158   NITRITE Negative 07/11/2022 1315   NITRITE NEGATIVE 12/17/2021 1158   LEUKOCYTESUR Negative 07/11/2022 1315   LEUKOCYTESUR NEGATIVE 12/17/2021 1158   LEUKOCYTESUR NEGATIVE 12/24/2012 2305    Pertinent Imaging:   Assessment & Plan: Patient has impressive pressure overactivity.  He has risk factors for neurogenic bladder.  He is urodynamically obstructed but he certainly could have persistent or worsening urge incontinence following a prostate procedure.  I will try to help with medical and behavioral therapy.  If this fails we will talk about refractory OAB therapy versus minimally invasive prostate procedures hoping to secondarily improve overactivity but also to improve flow.  My concern is that a prostate procedure may not reach his treatment goal and that this could be neurogenic in origin.  Like to start off with Flomax and have him come back for residual and proceed accordingly.  Combination treatment may be needed  Understands pathophysiology well.  He has been doing physical therapy and improving his back and he thinks the urgency is a bit better.  This may not be coincidental.  We will proceed accordingly.    There are no diagnoses linked to this encounter.  No follow-ups on file.  Reece Packer, MD  [facility] [address] [place], [place] [address] [phone]

## 2022-10-03 ENCOUNTER — Encounter: Payer: Self-pay | Admitting: Urology

## 2022-10-03 ENCOUNTER — Ambulatory Visit: Payer: Medicare Other | Admitting: Urology

## 2022-10-03 VITALS — BP 142/90 | HR 99 | Ht 67.0 in | Wt 242.0 lb

## 2022-10-03 DIAGNOSIS — N3941 Urge incontinence: Secondary | ICD-10-CM

## 2022-10-03 NOTE — Progress Notes (Signed)
10/03/2022 8:59 AM   Frank Golden 12-Oct-1955 [id number]  Referring provider: Lynnea Ferrier, MD [address] [facility] [place],  [place] [address]  Chief Complaint  Patient presents with   Follow-up   Urinary Incontinence    6 week follow-up    HPI: I was consulted to assess the patient's urgency incontinence that has had more than 1 year.  He wears 1 or 2 pads a day but up to 5-6 pads a day that can be quite wet.  He denies stress incontinence and bedwetting when he goes from sitting to standing position he feels the need to go.  He voids every hour and cannot hold it for 2 hours.  He has no nocturia   Flow can vary from poor to good.  30 minutes later he can double void a mild to moderate amount.  He thinks he feels empty most times   He has had low back surgery more than 5 years ago and gets injections.  He quit smoking years ago   I reviewed the medical records and he has some medical comorbidities with mild cognitive impairment and some anxiety.     50 g benign prostate   Patient has urgency incontinence and at times it is quite severe.  This is a presentation of the ordinary.  He has a frequent bladder and his flow varies.  His postvoid residual today was 46 mL .    On urodynamics patient did not void was catheterized for 100 mL.  Maximum bladder capacity was 388 mL.  Patient had high pressure bladder overactivity reaching pressure 107 cm of water.  He leaked a small amount.  No stress incontinence with Valsalva pressure of 128 cmH2O.  He was able to generate a voluntary contraction.  Since at least he started to void sitting down but had to stand or to empty his bladder well.  He voided 109 mL with a maximal flow 6 mL/s.  Interrupted flow pattern.  Max voiding pressure 69 cm water.  Residual was 279 mL.  EMG activity decreased during voiding.  He had a little bit of a elongated bladder with trabeculations.   Cystoscopy: Patient underwent flexible cystoscopy  utilizing sterile technique.  Penile bulbar urethra normal.  He had mild bilobar enlargement the prostate.  No high riding bladder neck.  Bladder mucosa and trigone were normal.  No cystitis.  No carcinoma.    Patient has impressive pressure overactivity.  He has risk factors for neurogenic bladder.  He is urodynamically obstructed but he certainly could have persistent or worsening urge incontinence following a prostate procedure.  I will try to help with medical and behavioral therapy.  If this fails we will talk about refractory OAB therapy versus minimally invasive prostate procedures hoping to secondarily improve overactivity but also to improve flow.  My concern is that a prostate procedure may not reach his treatment goal and that this could be neurogenic in origin.  Like to start off with Flomax and have him come back for residual and proceed accordingly.  Combination treatment may be needed   Understands pathophysiology well.  He has been doing physical therapy and improving his back and he thinks the urgency is a bit better.  This may not be coincidental.   Today Patient is almost completely dry and very happy.  Has uncommon urgency with little to no leak.  Flow modestly better.  He is very happy.  Frequency improved   PMH: Past Medical  History:  Diagnosis Date   Allergic state    Asthma    Cancer    skin cancer on face   Depression    DJD (degenerative joint disease)    ED (erectile dysfunction)    History of chicken pox    History of pancreatitis    Hyperlipidemia    Hypertension    Hypertension    Hypothyroid    OCD (obsessive compulsive disorder)    Psoriasis    Rheumatoid arthritis    Sleep apnea    uses CPAP    Surgical History: Past Surgical History:  Procedure Laterality Date   APPENDECTOMY     COLONOSCOPY N/A 04/07/2022   Procedure: COLONOSCOPY;  Surgeon: Jaynie Collins, DO;  Location: [facility] ENDOSCOPY;  Service: Gastroenterology;  Laterality: N/A;    CYSTECTOMY     endoscopic carpal tunnell     LAMINECTOMY     MOHS SURGERY     basal cell on nose   TOTAL HIP ARTHROPLASTY Left 12/28/2021   Procedure: TOTAL HIP ARTHROPLASTY ANTERIOR APPROACH;  Surgeon: Kennedy Bucker, MD;  Location: [facility] ORS;  Service: Orthopedics;  Laterality: Left;    Home Medications:  Allergies as of 10/03/2022       Reactions   Bee Venom Anaphylaxis   Gabapentin Other (See Comments)   Felt like jumping out of skin   Covid-19 (mrna) Vaccine Nausea And Vomiting   Vomiting x 2   Quinapril Hcl Cough   Remeron [mirtazapine] Other (See Comments)   NUMBNESS OF FACE        Medication List        Accurate as of October 03, 2022  8:59 AM. If you have any questions, ask your nurse or doctor.          acetaminophen 500 MG tablet Commonly known as: TYLENOL Take 1,000 mg by mouth 3 (three) times daily as needed.   albuterol 108 (90 Base) MCG/ACT inhaler Commonly known as: VENTOLIN HFA Inhale 2 puffs into the lungs as needed.   amLODipine 5 MG tablet Commonly known as: NORVASC Take 5 mg by mouth daily.   B-Complex/B-12 Tabs Take 1 tablet by mouth 2 (two) times daily.   budesonide-formoterol 160-4.5 MCG/ACT inhaler Commonly known as: SYMBICORT Inhale 2 puffs into the lungs 2 (two) times daily.   busPIRone 30 MG tablet Commonly known as: BUSPAR Take 30 mg by mouth 2 (two) times daily.   cetirizine 10 MG tablet Commonly known as: ZYRTEC Take 10 mg by mouth daily.   Coenzyme Q-10 100 MG capsule Take 100 mg by mouth daily.   EPINEPHrine 0.3 mg/0.3 mL Soaj injection Commonly known as: EPI-PEN Inject 0.3 mg into the muscle as needed.   fluticasone 50 MCG/ACT nasal spray Commonly known as: FLONASE Place 2 sprays into the nose daily.   hydrochlorothiazide 25 MG tablet Commonly known as: HYDRODIURIL Take 25 mg by mouth daily.   Levothyroxine Sodium 125 MCG Caps Take 125 mcg by mouth daily before breakfast.   MULTIVITAMIN ADULT PO Take 1 tablet  by mouth daily.   nortriptyline 75 MG capsule Commonly known as: PAMELOR Take 75 mg by mouth.   Polyethyl Glycol-Propyl Glycol 0.4-0.3 % Soln Apply 1 drop to eye 2 (two) times daily as needed.   pravastatin 20 MG tablet Commonly known as: PRAVACHOL Take 20 mg by mouth at bedtime.   QUEtiapine 50 MG tablet Commonly known as: SEROQUEL Take 50 mg by mouth at bedtime.   saw palmetto 160 MG capsule Take  1 capsule by mouth daily.   tamsulosin 0.4 MG Caps capsule Commonly known as: FLOMAX Take 1 capsule (0.4 mg total) by mouth daily.   tiZANidine 4 MG capsule Commonly known as: ZANAFLEX Take 4 mg by mouth 3 (three) times daily as needed for muscle spasms.   TURMERIC PO Take 1 capsule by mouth daily.   valsartan 320 MG tablet Commonly known as: DIOVAN Take 320 mg by mouth daily.        Allergies:  Allergies  Allergen Reactions   Bee Venom Anaphylaxis   Gabapentin Other (See Comments)    Felt like jumping out of skin   Covid-19 (Mrna) Vaccine Nausea And Vomiting    Vomiting x 2   Quinapril Hcl Cough   Remeron [Mirtazapine] Other (See Comments)    NUMBNESS OF FACE    Family History: Family History  Problem Relation Age of Onset   COPD Mother    Arthritis Mother    Osteoporosis Mother    Anxiety disorder Mother    Depression Mother    Stroke Father    Cancer Father        penile   Heart failure Father    Aneurysm Father    Hypertension Father    Coronary artery disease Father    Anxiety disorder Maternal Grandmother    Depression Maternal Grandmother     Social History:  reports that he quit smoking about 44 years ago. His smoking use included cigarettes. He has a 15.00 pack-year smoking history. He has been exposed to tobacco smoke. He has never used smokeless tobacco. He reports current alcohol use of about 3.0 standard drinks of alcohol per week. He reports that he does not use drugs.  ROS:                                         Physical Exam: BP (!) 142/90   Pulse 99   Ht 5\' 7"  (1.702 m)   Wt 109.8 kg   BMI 37.90 kg/m   Constitutional:  Alert and oriented, No acute distress. HEENT: [place] AT, moist mucus membranes.  Trachea midline, no masses.  Laboratory Data: Lab Results  Component Value Date   WBC 13.3 (H) 12/29/2021   HGB 12.5 (L) 12/29/2021   HCT 36.7 (L) 12/29/2021   MCV 88.0 12/29/2021   PLT 263 12/29/2021    Lab Results  Component Value Date   CREATININE 0.95 12/29/2021    No results found for: "PSA"  No results found for: "TESTOSTERONE"  Lab Results  Component Value Date   HGBA1C 6.1 12/25/2012    Urinalysis    Component Value Date/Time   COLORURINE YELLOW (A) 12/17/2021 1158   APPEARANCEUR Clear 07/11/2022 1315   LABSPEC 1.027 12/17/2021 1158   LABSPEC 1.010 12/24/2012 2305   PHURINE 5.0 12/17/2021 1158   GLUCOSEU Negative 07/11/2022 1315   GLUCOSEU NEGATIVE 12/24/2012 2305   HGBUR NEGATIVE 12/17/2021 1158   BILIRUBINUR Negative 07/11/2022 1315   BILIRUBINUR NEGATIVE 12/24/2012 2305   KETONESUR 5 (A) 12/17/2021 1158   PROTEINUR Negative 07/11/2022 1315   PROTEINUR NEGATIVE 12/17/2021 1158   NITRITE Negative 07/11/2022 1315   NITRITE NEGATIVE 12/17/2021 1158   LEUKOCYTESUR Negative 07/11/2022 1315   LEUKOCYTESUR NEGATIVE 12/17/2021 1158   LEUKOCYTESUR NEGATIVE 12/24/2012 2305    Pertinent Imaging:  Assessment & Plan:  Patient had dramatic response to Flomax.  Check another residual  in 6 months and then I will see him yearly  1. Urgency incontinence   - Urinalysis, Complete   No follow-ups on file.  Martina Sinner, MD  [facility] [address] [place], [place] [address] [phone]

## 2022-10-12 ENCOUNTER — Encounter: Payer: Self-pay | Admitting: Urology

## 2022-10-12 DIAGNOSIS — N3941 Urge incontinence: Secondary | ICD-10-CM

## 2022-10-12 MED ORDER — TAMSULOSIN HCL 0.4 MG PO CAPS: 0.4000 mg | ORAL_CAPSULE | Freq: Every day | ORAL | 3 refills | Status: DC

## 2023-04-03 ENCOUNTER — Ambulatory Visit: Payer: Medicare Other | Admitting: Urology

## 2023-04-03 ENCOUNTER — Encounter: Payer: Self-pay | Admitting: Urology

## 2023-04-03 DIAGNOSIS — N3941 Urge incontinence: Secondary | ICD-10-CM

## 2023-04-03 MED ORDER — TAMSULOSIN HCL 0.4 MG PO CAPS: 0.8000 mg | ORAL_CAPSULE | Freq: Every day | ORAL | 0 refills | Status: DC

## 2023-04-17 ENCOUNTER — Encounter: Payer: Self-pay | Admitting: Urology

## 2023-04-17 ENCOUNTER — Ambulatory Visit: Payer: Medicare Other | Admitting: Urology

## 2023-04-17 VITALS — BP 128/85 | HR 108 | Ht 67.0 in | Wt 247.0 lb

## 2023-04-17 DIAGNOSIS — N3941 Urge incontinence: Secondary | ICD-10-CM

## 2023-04-17 LAB — MICROSCOPIC EXAMINATION

## 2023-04-17 LAB — BLADDER SCAN AMB NON-IMAGING: Scan Result: 44

## 2023-04-17 LAB — URINALYSIS, COMPLETE
Bilirubin, UA: NEGATIVE
Glucose, UA: NEGATIVE
Ketones, UA: NEGATIVE
Leukocytes,UA: NEGATIVE
Nitrite, UA: NEGATIVE
Protein,UA: NEGATIVE
RBC, UA: NEGATIVE
Specific Gravity, UA: 1.015 (ref 1.005–1.030)
Urobilinogen, Ur: 0.2 mg/dL (ref 0.2–1.0)
pH, UA: 6 (ref 5.0–7.5)

## 2023-04-17 MED ORDER — GEMTESA 75 MG PO TABS: 1.0000 | ORAL_TABLET | Freq: Every day | ORAL | Status: DC

## 2023-04-17 NOTE — Progress Notes (Signed)
04/17/2023 1:26 PM   Frank Golden [birth year] [id number]  Referring provider: Lynnea Ferrier, MD [address] [facility] [place],  [place] [address]  Chief Complaint  Patient presents with   Follow-up    Urgency incontinence    HPI: I was consulted to assess the patient's urgency incontinence that has had more than 1 year.  He wears 1 or 2 pads a day but up to 5-6 pads a day that can be quite wet.  He denies stress incontinence and bedwetting when he goes from sitting to standing position he feels the need to go.  He voids every hour and cannot hold it for 2 hours.  He has no nocturia   Flow can vary from poor to good.  30 minutes later he can double void a mild to moderate amount.  He thinks he feels empty most times   He has had low back surgery more than 5 years ago and gets injections.  He quit smoking years ago   I reviewed the medical records and he has some medical comorbidities with mild cognitive impairment and some anxiety.     50 g benign prostate   Patient has urgency incontinence and at times it is quite severe.  This is a presentation of the ordinary.  He has a frequent bladder and his flow varies.  His postvoid residual today was 46 mL .    On urodynamics patient did not void was catheterized for 100 mL.  Maximum bladder capacity was 388 mL.  Patient had high pressure bladder overactivity reaching pressure 107 cm of water.  He leaked a small amount.  No stress incontinence with Valsalva pressure of 128 cmH2O.  He was able to generate a voluntary contraction.  Since at least he started to void sitting down but had to stand or to empty his bladder well.  He voided 109 mL with a maximal flow 6 mL/s.  Interrupted flow pattern.  Max voiding pressure 69 cm water.  Residual was 279 mL.  EMG activity decreased during voiding.  He had a little bit of a elongated bladder with trabeculations.   Cystoscopy: Patient underwent flexible cystoscopy utilizing sterile  technique.  Penile bulbar urethra normal.  He had mild bilobar enlargement the prostate.  No high riding bladder neck.  Bladder mucosa and trigone were normal.  No cystitis.  No carcinoma.     Patient has impressive pressure overactivity.  He has risk factors for neurogenic bladder.  He is urodynamically obstructed but he certainly could have persistent or worsening urge incontinence following a prostate procedure.  I will try to help with medical and behavioral therapy.  If this fails we will talk about refractory OAB therapy versus minimally invasive prostate procedures hoping to secondarily improve overactivity but also to improve flow.  My concern is that a prostate procedure may not reach his treatment goal and that this could be neurogenic in origin.  Like to start off with Flomax and have him come back for residual and proceed accordingly.  Combination treatment may be needed   Understands pathophysiology well.  He has been doing physical therapy and improving his back and he thinks the urgency is a bit better.  This may not be coincidental.    Today Patient is almost completely dry and very happy.  Has uncommon urgency with little to no leak.  Flow modestly better.  He is very happy.  Frequency improved Patient had dramatic response to Flomax.  Check  another residual in 6 months and then I will see him yearly  Today Residual 44 mL today.  He had called in and was increased to 0.8 mg of Flomax.  He started to have urge incontinence again.  Role of adding beta 3 agonist discussed.  No cystitis symptoms.  Clinically not infected         PMH: Past Medical History:  Diagnosis Date   Allergic state    Asthma    Cancer (HCC)    skin cancer on face   Depression    DJD (degenerative joint disease)    ED (erectile dysfunction)    History of chicken pox    History of pancreatitis    Hyperlipidemia    Hypertension    Hypertension    Hypothyroid    OCD (obsessive compulsive disorder)     Psoriasis    Rheumatoid arthritis (HCC)    Sleep apnea    uses CPAP    Surgical History: Past Surgical History:  Procedure Laterality Date   APPENDECTOMY     COLONOSCOPY N/A 04/07/2022   Procedure: COLONOSCOPY;  Surgeon: Jaynie Collins, DO;  Location: [facility] ENDOSCOPY;  Service: Gastroenterology;  Laterality: N/A;   CYSTECTOMY     endoscopic carpal tunnell     LAMINECTOMY     MOHS SURGERY     basal cell on nose   TOTAL HIP ARTHROPLASTY Left 12/28/2021   Procedure: TOTAL HIP ARTHROPLASTY ANTERIOR APPROACH;  Surgeon: Kennedy Bucker, MD;  Location: [facility] ORS;  Service: Orthopedics;  Laterality: Left;    Home Medications:  Allergies as of 04/17/2023       Reactions   Bee Venom Anaphylaxis   Gabapentin Other (See Comments)   Felt like jumping out of skin   Covid-19 (mrna) Vaccine Nausea And Vomiting   Vomiting x 2   Quinapril Hcl Cough   Remeron [mirtazapine] Other (See Comments)   NUMBNESS OF FACE        Medication List        Accurate as of April 17, 2023  1:26 PM. If you have any questions, ask your nurse or doctor.          acetaminophen 500 MG tablet Commonly known as: TYLENOL Take 1,000 mg by mouth 3 (three) times daily as needed.   albuterol 108 (90 Base) MCG/ACT inhaler Commonly known as: VENTOLIN HFA Inhale 2 puffs into the lungs as needed.   amLODipine 5 MG tablet Commonly known as: NORVASC Take 5 mg by mouth daily.   B-Complex/B-12 Tabs Take 1 tablet by mouth 2 (two) times daily.   budesonide-formoterol 160-4.5 MCG/ACT inhaler Commonly known as: SYMBICORT Inhale 2 puffs into the lungs 2 (two) times daily.   busPIRone 30 MG tablet Commonly known as: BUSPAR Take 30 mg by mouth 2 (two) times daily.   cetirizine 10 MG tablet Commonly known as: ZYRTEC Take 10 mg by mouth daily.   Coenzyme Q-10 100 MG capsule Take 100 mg by mouth daily.   EPINEPHrine 0.3 mg/0.3 mL Soaj injection Commonly known as: EPI-PEN Inject 0.3 mg into the muscle  as needed.   fluticasone 50 MCG/ACT nasal spray Commonly known as: FLONASE Place 2 sprays into the nose daily.   hydrochlorothiazide 25 MG tablet Commonly known as: HYDRODIURIL Take 25 mg by mouth daily.   Levothyroxine Sodium 125 MCG Caps Take 125 mcg by mouth daily before breakfast.   meloxicam 15 MG tablet Commonly known as: MOBIC Take by mouth.   MULTIVITAMIN ADULT PO Take  1 tablet by mouth daily.   nortriptyline 75 MG capsule Commonly known as: PAMELOR Take 75 mg by mouth.   Polyethyl Glycol-Propyl Glycol 0.4-0.3 % Soln Apply 1 drop to eye 2 (two) times daily as needed.   pravastatin 20 MG tablet Commonly known as: PRAVACHOL Take 20 mg by mouth at bedtime.   QUEtiapine 50 MG tablet Commonly known as: SEROQUEL Take 50 mg by mouth at bedtime.   saw palmetto 160 MG capsule Take 1 capsule by mouth daily.   tamsulosin 0.4 MG Caps capsule Commonly known as: FLOMAX Take 2 capsules (0.8 mg total) by mouth daily.   tiZANidine 4 MG capsule Commonly known as: ZANAFLEX Take 4 mg by mouth 3 (three) times daily as needed for muscle spasms.   triamcinolone cream 0.1 % Commonly known as: KENALOG SMARTSIG:1 Application Topical 2-3 Times Daily   TURMERIC PO Take 1 capsule by mouth daily.   valsartan 320 MG tablet Commonly known as: DIOVAN Take 320 mg by mouth daily.        Allergies:  Allergies  Allergen Reactions   Bee Venom Anaphylaxis   Gabapentin Other (See Comments)    Felt like jumping out of skin   Covid-19 (Mrna) Vaccine Nausea And Vomiting    Vomiting x 2   Quinapril Hcl Cough   Remeron [Mirtazapine] Other (See Comments)    NUMBNESS OF FACE    Family History: Family History  Problem Relation Age of Onset   COPD Mother    Arthritis Mother    Osteoporosis Mother    Anxiety disorder Mother    Depression Mother    Stroke Father    Cancer Father        penile   Heart failure Father    Aneurysm Father    Hypertension Father    Coronary  artery disease Father    Anxiety disorder Maternal Grandmother    Depression Maternal Grandmother     Social History:  reports that he quit smoking about 44 years ago. His smoking use included cigarettes. He started smoking about 59 years ago. He has a 15 pack-year smoking history. He has been exposed to tobacco smoke. He has never used smokeless tobacco. He reports current alcohol use of about 3.0 standard drinks of alcohol per week. He reports that he does not use drugs.  ROS:                                        Physical Exam: There were no vitals taken for this visit.  Constitutional:  Alert and oriented, No acute distress. HEENT: [place] AT, moist mucus membranes.  Trachea midline, no masses.   Laboratory Data: Lab Results  Component Value Date   WBC 13.3 (H) 12/29/2021   HGB 12.5 (L) 12/29/2021   HCT 36.7 (L) 12/29/2021   MCV 88.0 12/29/2021   PLT 263 12/29/2021    Lab Results  Component Value Date   CREATININE 0.95 12/29/2021    No results found for: "PSA"  No results found for: "TESTOSTERONE"  Lab Results  Component Value Date   HGBA1C 6.1 12/25/2012    Urinalysis    Component Value Date/Time   COLORURINE YELLOW (A) 12/17/2021 1158   APPEARANCEUR Clear 07/11/2022 1315   LABSPEC 1.027 12/17/2021 1158   LABSPEC 1.010 12/24/2012 2305   PHURINE 5.0 12/17/2021 1158   GLUCOSEU Negative 07/11/2022 1315   GLUCOSEU NEGATIVE 12/24/2012  2305   HGBUR NEGATIVE 12/17/2021 1158   BILIRUBINUR Negative 07/11/2022 1315   BILIRUBINUR NEGATIVE 12/24/2012 2305   KETONESUR 5 (A) 12/17/2021 1158   PROTEINUR Negative 07/11/2022 1315   PROTEINUR NEGATIVE 12/17/2021 1158   NITRITE Negative 07/11/2022 1315   NITRITE NEGATIVE 12/17/2021 1158   LEUKOCYTESUR Negative 07/11/2022 1315   LEUKOCYTESUR NEGATIVE 12/17/2021 1158   LEUKOCYTESUR NEGATIVE 12/24/2012 2305    Pertinent Imaging: Negative.  Urine  Assessment & Plan: I will see him back on Gemtesa in  combination with 1 Flomax a day.  We will try various overactive bladder medications and proceed accordingly.  He agreed with the plan.  We talked about learning how to shoot a gun today etc.  1. Urgency incontinence  - BLADDER SCAN AMB NON-IMAGING - Urinalysis, Complete   No follow-ups on file.  Martina Sinner, MD  [facility] [address] [place], [place] [address] [phone]

## 2023-05-19 ENCOUNTER — Other Ambulatory Visit: Payer: Self-pay | Admitting: *Deleted

## 2023-05-19 MED ORDER — TAMSULOSIN HCL 0.4 MG PO CAPS: 0.4000 mg | ORAL_CAPSULE | Freq: Every day | ORAL | 11 refills | Status: DC

## 2023-06-12 ENCOUNTER — Ambulatory Visit: Payer: Medicare Other | Admitting: Urology

## 2023-06-12 VITALS — BP 124/93 | HR 106

## 2023-06-12 DIAGNOSIS — N3941 Urge incontinence: Secondary | ICD-10-CM | POA: Diagnosis not present

## 2023-06-12 MED ORDER — MIRABEGRON ER 50 MG PO TB24: 50.0000 mg | ORAL_TABLET | Freq: Every day | ORAL | 11 refills | Status: AC

## 2023-06-12 NOTE — Progress Notes (Signed)
06/12/2023 2:36 PM   Frank Golden 16-Feb-1956 [id number]  Referring provider: Lynnea Ferrier, MD [address] [facility] [place],  [place] [address]  Chief Complaint  Patient presents with   Follow-up    HPI: I was consulted to assess the patient's urgency incontinence that has had more than 1 year.  He wears 1 or 2 pads a day but up to 5-6 pads a day that can be quite wet.  He denies stress incontinence and bedwetting when he goes from sitting to standing position he feels the need to go.  He voids every hour and cannot hold it for 2 hours.  He has no nocturia   Flow can vary from poor to good.  30 minutes later he can double void a mild to moderate amount.  He thinks he feels empty most times   He has had low back surgery more than 5 years ago and gets injections.  He quit smoking years ago   I reviewed the medical records and he has some medical comorbidities with mild cognitive impairment and some anxiety.     50 g benign prostate   Patient has urgency incontinence and at times it is quite severe.  This is a presentation of the ordinary.  He has a frequent bladder and his flow varies.  His postvoid residual today was 46 mL .    On urodynamics patient did not void was catheterized for 100 mL.  Maximum bladder capacity was 388 mL.  Patient had high pressure bladder overactivity reaching pressure 107 cm of water.  He leaked a small amount.  No stress incontinence with Valsalva pressure of 128 cmH2O.  He was able to generate a voluntary contraction.  Since at least he started to void sitting down but had to stand or to empty his bladder well.  He voided 109 mL with a maximal flow 6 mL/s.  Interrupted flow pattern.  Max voiding pressure 69 cm water.  Residual was 279 mL.  EMG activity decreased during voiding.  He had a little bit of a elongated bladder with trabeculations.   Cystoscopy: Patient underwent flexible cystoscopy utilizing sterile technique.  Penile bulbar  urethra normal.  He had mild bilobar enlargement the prostate.  No high riding bladder neck.  Bladder mucosa and trigone were normal.  No cystitis.  No carcinoma.     Patient has impressive pressure overactivity.  He has risk factors for neurogenic bladder.  He is urodynamically obstructed but he certainly could have persistent or worsening urge incontinence following a prostate procedure.  I will try to help with medical and behavioral therapy.  If this fails we will talk about refractory OAB therapy versus minimally invasive prostate procedures hoping to secondarily improve overactivity but also to improve flow.  My concern is that a prostate procedure may not reach his treatment goal and that this could be neurogenic in origin.  Like to start off with Flomax and have him come back for residual and proceed accordingly.  Combination treatment may be needed   Understands pathophysiology well.  He has been doing physical therapy and improving his back and he thinks the urgency is a bit better.  This may not be coincidental.    Today Patient is almost completely dry and very happy.  Has uncommon urgency with little to no leak.  Flow modestly better.  He is very happy.  Frequency improved Patient had dramatic response to Flomax.  Check another residual in 6 months  and then I will see him yearly   Today Residual 44 mL today.  He had called in and was increased to 0.8 mg of Flomax.  He started to have urge incontinence again.  Role of adding beta 3 agonist discussed.  No cystitis symptoms.  Clinically not infected   I will see him back on Gemtesa in combination with 1 Flomax a day. We will try various overactive bladder medications and proceed accordingly. He agreed with the plan. We talked about learning how to shoot a gun today etc.    Today Patient started to leak at least 3 pads a day and sometimes 5.  He is on Flomax 0.4 mg.  He is on the [place].  It did not help.  He now sprays a bit when he  urinates and this had improved before.  Clinically not infected but urine sent for culture    PMH: Past Medical History:  Diagnosis Date   Allergic state    Asthma    Cancer (HCC)    skin cancer on face   Depression    DJD (degenerative joint disease)    ED (erectile dysfunction)    History of chicken pox    History of pancreatitis    Hyperlipidemia    Hypertension    Hypertension    Hypothyroid    OCD (obsessive compulsive disorder)    Psoriasis    Rheumatoid arthritis (HCC)    Sleep apnea    uses CPAP    Surgical History: Past Surgical History:  Procedure Laterality Date   APPENDECTOMY     COLONOSCOPY N/A 04/07/2022   Procedure: COLONOSCOPY;  Surgeon: Jaynie Collins, DO;  Location: [facility] ENDOSCOPY;  Service: Gastroenterology;  Laterality: N/A;   CYSTECTOMY     endoscopic carpal tunnell     LAMINECTOMY     MOHS SURGERY     basal cell on nose   TOTAL HIP ARTHROPLASTY Left 12/28/2021   Procedure: TOTAL HIP ARTHROPLASTY ANTERIOR APPROACH;  Surgeon: Kennedy Bucker, MD;  Location: [facility] ORS;  Service: Orthopedics;  Laterality: Left;    Home Medications:  Allergies as of 06/12/2023       Reactions   Bee Venom Anaphylaxis   Gabapentin Other (See Comments)   Felt like jumping out of skin   Covid-19 (mrna) Vaccine Nausea And Vomiting   Vomiting x 2   Quinapril Hcl Cough   Remeron [mirtazapine] Other (See Comments)   NUMBNESS OF FACE        Medication List        Accurate as of June 12, 2023  2:36 PM. If you have any questions, ask your nurse or doctor.          acetaminophen 500 MG tablet Commonly known as: TYLENOL Take 1,000 mg by mouth 3 (three) times daily as needed.   albuterol 108 (90 Base) MCG/ACT inhaler Commonly known as: VENTOLIN HFA Inhale 2 puffs into the lungs as needed.   amLODipine 5 MG tablet Commonly known as: NORVASC Take 5 mg by mouth daily.   B-Complex/B-12 Tabs Take 1 tablet by mouth 2 (two) times daily.    budesonide-formoterol 160-4.5 MCG/ACT inhaler Commonly known as: SYMBICORT Inhale 2 puffs into the lungs 2 (two) times daily.   busPIRone 30 MG tablet Commonly known as: BUSPAR Take 30 mg by mouth 2 (two) times daily.   cetirizine 10 MG tablet Commonly known as: ZYRTEC Take 10 mg by mouth daily.   Coenzyme Q-10 100 MG capsule Take 100 mg  by mouth daily.   EPINEPHrine 0.3 mg/0.3 mL Soaj injection Commonly known as: EPI-PEN Inject 0.3 mg into the muscle as needed.   fluticasone 50 MCG/ACT nasal spray Commonly known as: FLONASE Place 2 sprays into the nose daily.   Gemtesa 75 MG Tabs Generic drug: Vibegron Take 1 tablet (75 mg total) by mouth daily.   hydrochlorothiazide 25 MG tablet Commonly known as: HYDRODIURIL Take 25 mg by mouth daily.   Levothyroxine Sodium 125 MCG Caps Take 125 mcg by mouth daily before breakfast.   meloxicam 15 MG tablet Commonly known as: MOBIC Take by mouth.   MULTIVITAMIN ADULT PO Take 1 tablet by mouth daily.   nortriptyline 75 MG capsule Commonly known as: PAMELOR Take 75 mg by mouth.   Polyethyl Glycol-Propyl Glycol 0.4-0.3 % Soln Apply 1 drop to eye 2 (two) times daily as needed.   pravastatin 20 MG tablet Commonly known as: PRAVACHOL Take 20 mg by mouth at bedtime.   QUEtiapine 50 MG tablet Commonly known as: SEROQUEL Take 50 mg by mouth at bedtime.   saw palmetto 160 MG capsule Take 1 capsule by mouth daily.   tamsulosin 0.4 MG Caps capsule Commonly known as: FLOMAX Take 1 capsule (0.4 mg total) by mouth daily.   tiZANidine 4 MG capsule Commonly known as: ZANAFLEX Take 4 mg by mouth 3 (three) times daily as needed for muscle spasms.   triamcinolone cream 0.1 % Commonly known as: KENALOG SMARTSIG:1 Application Topical 2-3 Times Daily   TURMERIC PO Take 1 capsule by mouth daily.   valsartan 320 MG tablet Commonly known as: DIOVAN Take 320 mg by mouth daily.        Allergies:  Allergies  Allergen  Reactions   Bee Venom Anaphylaxis   Gabapentin Other (See Comments)    Felt like jumping out of skin   Covid-19 (Mrna) Vaccine Nausea And Vomiting    Vomiting x 2   Quinapril Hcl Cough   Remeron [Mirtazapine] Other (See Comments)    NUMBNESS OF FACE    Family History: Family History  Problem Relation Age of Onset   COPD Mother    Arthritis Mother    Osteoporosis Mother    Anxiety disorder Mother    Depression Mother    Stroke Father    Cancer Father        penile   Heart failure Father    Aneurysm Father    Hypertension Father    Coronary artery disease Father    Anxiety disorder Maternal Grandmother    Depression Maternal Grandmother     Social History:  reports that he quit smoking about 45 years ago. His smoking use included cigarettes. He started smoking about 60 years ago. He has a 15 pack-year smoking history. He has been exposed to tobacco smoke. He has never used smokeless tobacco. He reports current alcohol use of about 3.0 standard drinks of alcohol per week. He reports that he does not use drugs.  ROS:                                        Physical Exam: BP (!) 124/93   Pulse (!) 106   Constitutional:  Alert and oriented, No acute distress. HEENT: [place] AT, moist mucus membranes.  Trachea midline, no masses.  Laboratory Data: Lab Results  Component Value Date   WBC 13.3 (H) 12/29/2021   HGB 12.5 (L)  12/29/2021   HCT 36.7 (L) 12/29/2021   MCV 88.0 12/29/2021   PLT 263 12/29/2021    Lab Results  Component Value Date   CREATININE 0.95 12/29/2021    No results found for: "PSA"  No results found for: "TESTOSTERONE"  Lab Results  Component Value Date   HGBA1C 6.1 12/25/2012    Urinalysis    Component Value Date/Time   COLORURINE YELLOW (A) 12/17/2021 1158   APPEARANCEUR Clear 04/17/2023 1326   LABSPEC 1.027 12/17/2021 1158   LABSPEC 1.010 12/24/2012 2305   PHURINE 5.0 12/17/2021 1158   GLUCOSEU Negative 04/17/2023  1326   GLUCOSEU NEGATIVE 12/24/2012 2305   HGBUR NEGATIVE 12/17/2021 1158   BILIRUBINUR Negative 04/17/2023 1326   BILIRUBINUR NEGATIVE 12/24/2012 2305   KETONESUR 5 (A) 12/17/2021 1158   PROTEINUR Negative 04/17/2023 1326   PROTEINUR NEGATIVE 12/17/2021 1158   NITRITE Negative 04/17/2023 1326   NITRITE NEGATIVE 12/17/2021 1158   LEUKOCYTESUR Negative 04/17/2023 1326   LEUKOCYTESUR NEGATIVE 12/17/2021 1158   LEUKOCYTESUR NEGATIVE 12/24/2012 2305    Pertinent Imaging: Urine reviewed and sent for culture  Assessment & Plan: Back on Flomax 0.8 mg.  Add Myrbetriq.  Check residual in 6 weeks.  Proceed accordingly.  Also try an antimuscarinic if he has not already been on 1.  I might be a little bit concerned again and doing prostate surgery for her is urge incontinence.  We will proceed accordingly  1. Urgency incontinence (Primary)  - Urinalysis, Complete  2. Urge incontinence of urine  - Urinalysis, Complete   No follow-ups on file.  Martina Sinner, MD  [facility] [address] [place], [place] [address] [phone]

## 2023-06-12 NOTE — Addendum Note (Signed)
Addended by: Sueanne Margarita on: 06/12/2023 02:50 PM   Modules accepted: Orders

## 2023-06-13 LAB — URINALYSIS, COMPLETE
Bilirubin, UA: NEGATIVE
Glucose, UA: NEGATIVE
Ketones, UA: NEGATIVE
Leukocytes,UA: NEGATIVE
Nitrite, UA: NEGATIVE
Protein,UA: NEGATIVE
RBC, UA: NEGATIVE
Specific Gravity, UA: >1.03 — ABNORMAL HIGH (ref 1.005–1.030)
Urobilinogen, Ur: 0.2 mg/dL (ref 0.2–1.0)
pH, UA: 6 (ref 5.0–7.5)

## 2023-06-13 LAB — MICROSCOPIC EXAMINATION: Bacteria, UA: NONE SEEN

## 2023-07-04 ENCOUNTER — Other Ambulatory Visit: Payer: Self-pay | Admitting: Orthopedic Surgery

## 2023-07-11 ENCOUNTER — Other Ambulatory Visit: Payer: Self-pay

## 2023-07-11 ENCOUNTER — Encounter
Admission: RE | Admit: 2023-07-11 | Discharge: 2023-07-11 | Disposition: A | Discharge status: Home / Self Care | Payer: Medicare Other | Source: Ambulatory Visit | Attending: Orthopedic Surgery | Admitting: Orthopedic Surgery

## 2023-07-11 VITALS — BP 129/87 | HR 102 | Temp 97.6°F | Resp 18 | Ht 68.0 in | Wt 231.2 lb

## 2023-07-11 DIAGNOSIS — Z01812 Encounter for preprocedural laboratory examination: Secondary | ICD-10-CM | POA: Diagnosis present

## 2023-07-11 DIAGNOSIS — Z01810 Encounter for preprocedural cardiovascular examination: Secondary | ICD-10-CM | POA: Diagnosis present

## 2023-07-11 DIAGNOSIS — I10 Essential (primary) hypertension: Secondary | ICD-10-CM | POA: Insufficient documentation

## 2023-07-11 DIAGNOSIS — Z01818 Encounter for other preprocedural examination: Secondary | ICD-10-CM | POA: Insufficient documentation

## 2023-07-11 DIAGNOSIS — E785 Hyperlipidemia, unspecified: Secondary | ICD-10-CM

## 2023-07-11 HISTORY — DX: Chronic obstructive pulmonary disease, unspecified: J44.9

## 2023-07-11 HISTORY — DX: Psychophysiologic insomnia: F51.04

## 2023-07-11 HISTORY — DX: Pain in unspecified joint: M25.50

## 2023-07-11 HISTORY — DX: Mild cognitive impairment of uncertain or unknown etiology: G31.84

## 2023-07-11 HISTORY — DX: Disorder of pituitary gland, unspecified: E23.7

## 2023-07-11 LAB — TYPE AND SCREEN
ABO/RH(D): A POS
Antibody Screen: NEGATIVE

## 2023-07-11 LAB — SURGICAL PCR SCREEN
MRSA, PCR: NEGATIVE
Staphylococcus aureus: NEGATIVE

## 2023-07-11 NOTE — Patient Instructions (Addendum)
Your procedure is scheduled on:   Thursday, February 6  Report to the Registration Desk on the 1st floor of the [facility] To find out your arrival time, please call [phone] between 1PM - 3PM on:  Wednesday , February 5 If your arrival time is 6:00 am, do not arrive before that time as the [facility] entrance doors do not open until 6:00 am.  REMEMBER: Instructions that are not followed completely may result in serious medical risk, up to and including death; or upon the discretion of your surgeon and anesthesiologist your surgery may need to be rescheduled.  Do not eat food after midnight the night before surgery.  No gum chewing or hard candies.  You may however, drink CLEAR liquids up to 2 hours before you are scheduled to arrive for your surgery. Do not drink anything within 2 hours of your scheduled arrival time.  Clear liquids include: - water  - apple juice without pulp - gatorade (not RED colors) - black coffee or tea (Do NOT add milk or creamers to the coffee or tea) Do NOT drink anything that is not on this list.  In addition, your doctor has ordered for you to drink the provided:  Ensure Pre-Surgery Clear Carbohydrate Drink   Drinking this carbohydrate drink up to two hours before surgery helps to reduce insulin resistance and improve patient outcomes. Please complete drinking 2 hours before scheduled arrival time.  One week prior to surgery:  Thursday January 30  Stop Anti-inflammatories (NSAIDS) such as Advil, Aleve, Ibuprofen, Motrin, Naproxen, Naprosyn and Aspirin based products such as Excedrin, Goody's Powder, BC Powder.  Stop ANY OVER THE COUNTER supplements until after surgery. cetirizine (ZYRTEC)  B Complex Vitamins (B-COMPLEX/B-12)  Coenzyme Q-10  fluticasone (FLONASE)  Multiple Vitamins-Minerals (MULTIVITAMIN ) TURMERIC  vitamin E   You may however, continue to take Tylenol if needed for pain up until the day of surgery.  Continue taking all of  your other prescription medications up until the day of surgery.  ON THE DAY OF SURGERY ONLY TAKE THESE MEDICATIONS WITH SIPS OF WATER:  amLODipine (NORVASC)  busPIRone (BUSPAR)  levothyroxine (SYNTHROID)  tamsulosin (FLOMAX)   Use inhalers on the day of surgery and bring to the hospital. albuterol (PROVENTIL HFA;VENTOLIN HFA)  budesonide-formoterol (SYMBICORT)   No Alcohol for 24 hours before or after surgery.  No Smoking including e-cigarettes for 24 hours before surgery.  No chewable tobacco products for at least 6 hours before surgery.  No nicotine patches on the day of surgery.  Do not use any "recreational" drugs for at least a week (preferably 2 weeks) before your surgery.  Please be advised that the combination of cocaine and anesthesia may have negative outcomes, up to and including death. If you test positive for cocaine, your surgery will be cancelled.  On the morning of surgery brush your teeth with toothpaste and water, you may rinse your mouth with mouthwash if you wish. Do not swallow any toothpaste or mouthwash.  Use CHG Soap as directed on instruction sheet.  Do not wear jewelry, make-up, hairpins, clips or nail polish.  For welded (permanent) jewelry: bracelets, anklets, waist bands, etc.  Please have this removed prior to surgery.  If it is not removed, there is a chance that hospital personnel will need to cut it off on the day of surgery.  Do not wear lotions, powders, or perfumes.   Do not shave body hair from the neck down 48 hours before surgery.  Contact lenses, hearing aids and dentures may not be worn into surgery.  Do not bring valuables to the hospital. [facility] is not responsible for any missing/lost belongings or valuables.   Notify your doctor if there is any change in your medical condition (cold, fever, infection).  Wear comfortable clothing (specific to your surgery type) to the hospital.  After surgery, you can help prevent lung  complications by doing breathing exercises.  Take deep breaths and cough every 1-2 hours.   If you are being admitted to the hospital overnight, leave your suitcase in the car. After surgery it may be brought to your room.  In case of increased patient census, it may be necessary for you, the patient, to continue your postoperative care in the Same Day Surgery department.  If you are being discharged the day of surgery, you will not be allowed to drive home. You will need a responsible individual to drive you home and stay with you for 24 hours after surgery.   If you are taking public transportation, you will need to have a responsible individual with you.  Please call the Pre-admissions Testing Dept. at [phone] if you have any questions about these instructions.  Surgery Visitation Policy:  Patients having surgery or a procedure may have two visitors.  Children under the age of 54 must have an adult with them who is not the patient.  Temporary Visitor Restrictions Due to increasing cases of flu, RSV and COVID-19: Children ages 53 and under will not be able to visit patients in [facility] hospitals under most circumstances.  Inpatient Visitation:    Visiting hours are 7 a.m. to 8 p.m. Up to four visitors are allowed at one time in a patient room. The visitors may rotate out with other people during the day.  One visitor age 40 or older may stay with the patient overnight and must be in the room by 8 p.m.        Pre-operative 5 CHG Bath Instructions   You can play a key role in reducing the risk of infection after surgery. Your skin needs to be as free of germs as possible. You can reduce the number of germs on your skin by washing with CHG (chlorhexidine gluconate) soap before surgery. CHG is an antiseptic soap that kills germs and continues to kill germs even after washing.   DO NOT use if you have an allergy to chlorhexidine/CHG or antibacterial soaps. If your skin  becomes reddened or irritated, stop using the CHG and notify one of our RNs at [phone].   Please shower with the CHG soap starting 4 days before surgery using the following schedule:     Please keep in mind the following:  DO NOT shave, including legs and underarms, starting the day of your first shower.   You may shave your face at any point before/day of surgery.  Place clean sheets on your bed the day you start using CHG soap. Use a clean washcloth (not used since being washed) for each shower. DO NOT sleep with pets once you start using the CHG.   CHG Shower Instructions:  If you choose to wash your hair and private area, wash first with your normal shampoo/soap.  After you use shampoo/soap, rinse your hair and body thoroughly to remove shampoo/soap residue.  Turn the water OFF and apply about 3 tablespoons (45 ml) of CHG soap to a CLEAN washcloth.  Apply CHG soap ONLY FROM YOUR NECK DOWN TO  YOUR TOES (washing for 3-5 minutes)  DO NOT use CHG soap on face, private areas, open wounds, or sores.  Pay special attention to the area where your surgery is being performed.  If you are having back surgery, having someone wash your back for you may be helpful. Wait 2 minutes after CHG soap is applied, then you may rinse off the CHG soap.  Pat dry with a clean towel  Put on clean clothes/pajamas   If you choose to wear lotion, please use ONLY the CHG-compatible lotions on the back of this paper.     Additional instructions for the day of surgery: DO NOT APPLY any lotions, deodorants, cologne, or perfumes.   Put on clean/comfortable clothes.  Brush your teeth.  Ask your nurse before applying any prescription medications to the skin.      CHG Compatible Lotions   Aveeno Moisturizing lotion  Cetaphil Moisturizing Cream  Cetaphil Moisturizing Lotion  Clairol Herbal Essence Moisturizing Lotion, Dry Skin  Clairol Herbal Essence Moisturizing Lotion, Extra Dry Skin  Clairol Herbal  Essence Moisturizing Lotion, Normal Skin  Curel Age Defying Therapeutic Moisturizing Lotion with Alpha Hydroxy  Curel Extreme Care Body Lotion  Curel Soothing Hands Moisturizing Hand Lotion  Curel Therapeutic Moisturizing Cream, Fragrance-Free  Curel Therapeutic Moisturizing Lotion, Fragrance-Free  Curel Therapeutic Moisturizing Lotion, Original Formula  Eucerin Daily Replenishing Lotion  Eucerin Dry Skin Therapy Plus Alpha Hydroxy Crme  Eucerin Dry Skin Therapy Plus Alpha Hydroxy Lotion  Eucerin Original Crme  Eucerin Original Lotion  Eucerin Plus Crme Eucerin Plus Lotion  Eucerin TriLipid Replenishing Lotion  Keri Anti-Bacterial Hand Lotion  Keri Deep Conditioning Original Lotion Dry Skin Formula Softly Scented  Keri Deep Conditioning Original Lotion, Fragrance Free Sensitive Skin Formula  Keri Lotion Fast Absorbing Fragrance Free Sensitive Skin Formula  Keri Lotion Fast Absorbing Softly Scented Dry Skin Formula  Keri Original Lotion  Keri Skin Renewal Lotion Keri Silky Smooth Lotion  Keri Silky Smooth Sensitive Skin Lotion  Nivea Body Creamy Conditioning Oil  Nivea Body Extra Enriched Lotion  Nivea Body Original Lotion  Nivea Body Sheer Moisturizing Lotion Nivea Crme  Nivea Skin Firming Lotion  NutraDerm 30 Skin Lotion  NutraDerm Skin Lotion  NutraDerm Therapeutic Skin Cream  NutraDerm Therapeutic Skin Lotion  ProShield Protective Hand Cream  Provon moisturizing lotion      How to Use an Incentive Spirometer  An incentive spirometer is a tool that measures how well you are filling your lungs with each breath. Learning to take long, deep breaths using this tool can help you keep your lungs clear and active. This may help to reverse or lessen your chance of developing breathing (pulmonary) problems, especially infection. You may be asked to use a spirometer: After a surgery. If you have a lung problem or a history of smoking. After a long period of time when you have  been unable to move or be active. If the spirometer includes an indicator to show the highest number that you have reached, your health care provider or respiratory therapist will help you set a goal. Keep a log of your progress as told by your health care provider. What are the risks? Breathing too quickly may cause dizziness or cause you to pass out. Take your time so you do not get dizzy or light-headed. If you are in pain, you may need to take pain medicine before doing incentive spirometry. It is harder to take a deep breath if you are having pain. How to use  your incentive spirometer  Sit up on the edge of your bed or on a chair. Hold the incentive spirometer so that it is in an upright position. Before you use the spirometer, breathe out normally. Place the mouthpiece in your mouth. Make sure your lips are closed tightly around it. Breathe in slowly and as deeply as you can through your mouth, causing the piston or the ball to rise toward the top of the chamber. Hold your breath for 3-5 seconds, or for as long as possible. If the spirometer includes a coach indicator, use this to guide you in breathing. Slow down your breathing if the indicator goes above the marked areas. Remove the mouthpiece from your mouth and breathe out normally. The piston or ball will return to the bottom of the chamber. Rest for a few seconds, then repeat the steps 10 or more times. Take your time and take a few normal breaths between deep breaths so that you do not get dizzy or light-headed. Do this every 1-2 hours when you are awake. If the spirometer includes a goal marker to show the highest number you have reached (best effort), use this as a goal to work toward during each repetition. After each set of 10 deep breaths, cough a few times. This will help to make sure that your lungs are clear. If you have an incision on your chest or abdomen from surgery, place a pillow or a rolled-up towel firmly against the  incision when you cough. This can help to reduce pain while taking deep breaths and coughing. General tips When you are able to get out of bed: Walk around often. Continue to take deep breaths and cough in order to clear your lungs. Keep using the incentive spirometer until your health care provider says it is okay to stop using it. If you have been in the hospital, you may be told to keep using the spirometer at home. Contact a health care provider if: You are having difficulty using the spirometer. You have trouble using the spirometer as often as instructed. Your pain medicine is not giving enough relief for you to use the spirometer as told. You have a fever. Get help right away if: You develop shortness of breath. You develop a cough with bloody mucus from the lungs. You have fluid or blood coming from an incision site after you cough. Summary An incentive spirometer is a tool that can help you learn to take long, deep breaths to keep your lungs clear and active. You may be asked to use a spirometer after a surgery, if you have a lung problem or a history of smoking, or if you have been inactive for a long period of time. Use your incentive spirometer as instructed every 1-2 hours while you are awake. If you have an incision on your chest or abdomen, place a pillow or a rolled-up towel firmly against your incision when you cough. This will help to reduce pain. Get help right away if you have shortness of breath, you cough up bloody mucus, or blood comes from your incision when you cough. This information is not intended to replace advice given to you by your health care provider. Make sure you discuss any questions you have with your health care provider. Document Revised: 08/19/2019 Document Reviewed: 08/19/2019 Elsevier Patient Education  2023 Elsevier Inc.             Preoperative Educational Videos for Total Hip, Knee and Shoulder Replacements  To better  prepare for  surgery, please view our videos that explain the physical activity and discharge planning required to have the best surgical recovery at [facility].  [email or web address]  Questions? Call [phone] or email [email or web address]

## 2023-07-15 ENCOUNTER — Encounter: Payer: Self-pay | Admitting: Urology

## 2023-07-20 ENCOUNTER — Ambulatory Visit: Payer: Medicare Other | Admitting: Anesthesiology

## 2023-07-20 ENCOUNTER — Other Ambulatory Visit: Payer: Self-pay

## 2023-07-20 ENCOUNTER — Ambulatory Visit: Payer: Medicare Other

## 2023-07-20 ENCOUNTER — Encounter
Admission: RE | Disposition: A | Discharge status: Home Health | Payer: Self-pay | Source: Ambulatory Visit | Attending: Orthopedic Surgery

## 2023-07-20 ENCOUNTER — Ambulatory Visit
Admission: RE | Admit: 2023-07-20 | Discharge: 2023-07-21 | Disposition: A | Discharge status: Home Health | Payer: Medicare Other | Source: Ambulatory Visit | Attending: Orthopedic Surgery | Admitting: Orthopedic Surgery

## 2023-07-20 ENCOUNTER — Encounter: Payer: Self-pay | Admitting: Orthopedic Surgery

## 2023-07-20 ENCOUNTER — Ambulatory Visit: Payer: Medicare Other | Admitting: Urgent Care

## 2023-07-20 DIAGNOSIS — Z96652 Presence of left artificial knee joint: Secondary | ICD-10-CM | POA: Diagnosis not present

## 2023-07-20 DIAGNOSIS — I10 Essential (primary) hypertension: Secondary | ICD-10-CM

## 2023-07-20 DIAGNOSIS — M1611 Unilateral primary osteoarthritis, right hip: Secondary | ICD-10-CM | POA: Insufficient documentation

## 2023-07-20 DIAGNOSIS — Z01812 Encounter for preprocedural laboratory examination: Secondary | ICD-10-CM

## 2023-07-20 DIAGNOSIS — Z6837 Body mass index (BMI) 37.0-37.9, adult: Secondary | ICD-10-CM | POA: Diagnosis not present

## 2023-07-20 DIAGNOSIS — Z96641 Presence of right artificial hip joint: Secondary | ICD-10-CM | POA: Diagnosis present

## 2023-07-20 HISTORY — PX: TOTAL HIP ARTHROPLASTY: SHX124

## 2023-07-20 SURGERY — ARTHROPLASTY, HIP, TOTAL, ANTERIOR APPROACH
Anesthesia: General | Site: Hip | Laterality: Right

## 2023-07-20 MED ORDER — MIDAZOLAM HCL 2 MG/2ML IJ SOLN
INTRAMUSCULAR | Status: AC
  Filled 2023-07-20: qty 2

## 2023-07-20 MED ORDER — ACETAMINOPHEN 325 MG PO TABS: 325.0000 mg | ORAL_TABLET | Freq: Four times a day (QID) | ORAL | Status: DC | PRN

## 2023-07-20 MED ORDER — POLYVINYL ALCOHOL 1.4 % OP SOLN: 1.0000 [drp] | Freq: Two times a day (BID) | OPHTHALMIC | Status: DC | PRN

## 2023-07-20 MED ORDER — KETOROLAC TROMETHAMINE 15 MG/ML IJ SOLN
7.5000 mg | Freq: Four times a day (QID) | INTRAMUSCULAR | Status: DC
  Administered 2023-07-20 – 2023-07-21 (×3): 7.5 mg via INTRAVENOUS
  Filled 2023-07-20 (×3): qty 1

## 2023-07-20 MED ORDER — METOCLOPRAMIDE HCL 5 MG/ML IJ SOLN: 5.0000 mg | Freq: Three times a day (TID) | INTRAMUSCULAR | Status: DC | PRN

## 2023-07-20 MED ORDER — ENOXAPARIN SODIUM 40 MG/0.4ML IJ SOSY
40.0000 mg | PREFILLED_SYRINGE | INTRAMUSCULAR | Status: DC
  Administered 2023-07-21: 40 mg via SUBCUTANEOUS

## 2023-07-20 MED ORDER — METOCLOPRAMIDE HCL 10 MG PO TABS: 5.0000 mg | ORAL_TABLET | Freq: Three times a day (TID) | ORAL | Status: DC | PRN

## 2023-07-20 MED ORDER — GLYCOPYRROLATE 0.2 MG/ML IJ SOLN
INTRAMUSCULAR | Status: AC
  Filled 2023-07-20: qty 1

## 2023-07-20 MED ORDER — TRANEXAMIC ACID-NACL 1000-0.7 MG/100ML-% IV SOLN
INTRAVENOUS | Status: AC
  Filled 2023-07-20: qty 100

## 2023-07-20 MED ORDER — SODIUM CHLORIDE (PF) 0.9 % IJ SOLN
INTRAMUSCULAR | Status: DC | PRN
  Administered 2023-07-20: 50 mL via INTRAMUSCULAR

## 2023-07-20 MED ORDER — NORTRIPTYLINE HCL 25 MG PO CAPS
75.0000 mg | ORAL_CAPSULE | Freq: Every day | ORAL | Status: DC
  Administered 2023-07-20: 75 mg via ORAL
  Filled 2023-07-20: qty 3

## 2023-07-20 MED ORDER — HYDROCHLOROTHIAZIDE 25 MG PO TABS
25.0000 mg | ORAL_TABLET | Freq: Every day | ORAL | Status: DC
  Administered 2023-07-20 – 2023-07-21 (×2): 25 mg via ORAL
  Filled 2023-07-20 (×2): qty 1

## 2023-07-20 MED ORDER — SURGIPHOR WOUND IRRIGATION SYSTEM - OPTIME: TOPICAL | Status: DC | PRN

## 2023-07-20 MED ORDER — GLYCOPYRROLATE 0.2 MG/ML IJ SOLN
INTRAMUSCULAR | Status: DC | PRN
  Administered 2023-07-20: .2 mg via INTRAVENOUS

## 2023-07-20 MED ORDER — FENTANYL CITRATE (PF) 100 MCG/2ML IJ SOLN
25.0000 ug | INTRAMUSCULAR | Status: DC | PRN
Start: 2023-07-20 — End: 2023-07-20

## 2023-07-20 MED ORDER — PROPOFOL 10 MG/ML IV BOLUS
INTRAVENOUS | Status: DC | PRN
  Administered 2023-07-20: 150 mg via INTRAVENOUS

## 2023-07-20 MED ORDER — BUSPIRONE HCL 10 MG PO TABS
30.0000 mg | ORAL_TABLET | Freq: Two times a day (BID) | ORAL | Status: DC
  Administered 2023-07-20 – 2023-07-21 (×2): 30 mg via ORAL
  Filled 2023-07-20 (×2): qty 3

## 2023-07-20 MED ORDER — TRAMADOL HCL 50 MG PO TABS
50.0000 mg | ORAL_TABLET | Freq: Four times a day (QID) | ORAL | Status: DC | PRN
  Administered 2023-07-20 – 2023-07-21 (×2): 50 mg via ORAL
  Filled 2023-07-20: qty 1

## 2023-07-20 MED ORDER — CHLORHEXIDINE GLUCONATE 0.12 % MT SOLN
15.0000 mL | Freq: Once | OROMUCOSAL | Status: DC
  Administered 2023-07-20: 15 mL via OROMUCOSAL

## 2023-07-20 MED ORDER — DEXAMETHASONE SODIUM PHOSPHATE 10 MG/ML IJ SOLN
8.0000 mg | Freq: Once | INTRAMUSCULAR | Status: AC
  Administered 2023-07-20: 8 mg via INTRAVENOUS

## 2023-07-20 MED ORDER — MIRABEGRON ER 50 MG PO TB24
50.0000 mg | ORAL_TABLET | Freq: Every day | ORAL | Status: DC
  Administered 2023-07-20: 50 mg via ORAL
  Filled 2023-07-20: qty 1

## 2023-07-20 MED ORDER — TAMSULOSIN HCL 0.4 MG PO CAPS
0.8000 mg | ORAL_CAPSULE | Freq: Every day | ORAL | Status: DC
  Administered 2023-07-20 – 2023-07-21 (×2): 0.8 mg via ORAL
  Filled 2023-07-20 (×2): qty 2

## 2023-07-20 MED ORDER — OXYCODONE HCL 5 MG/5ML PO SOLN: 5.0000 mg | Freq: Once | ORAL | Status: DC | PRN

## 2023-07-20 MED ORDER — ORAL CARE MOUTH RINSE: 15.0000 mL | Freq: Once | OROMUCOSAL | Status: DC

## 2023-07-20 MED ORDER — PHENYLEPHRINE 80 MCG/ML (10ML) SYRINGE FOR IV PUSH (FOR BLOOD PRESSURE SUPPORT)
PREFILLED_SYRINGE | INTRAVENOUS | Status: DC | PRN
  Administered 2023-07-20 (×2): 80 ug via INTRAVENOUS
  Administered 2023-07-20 (×2): 160 ug via INTRAVENOUS

## 2023-07-20 MED ORDER — ONDANSETRON HCL 4 MG/2ML IJ SOLN
INTRAMUSCULAR | Status: AC
  Filled 2023-07-20: qty 2

## 2023-07-20 MED ORDER — CEFAZOLIN SODIUM-DEXTROSE 2-4 GM/100ML-% IV SOLN
INTRAVENOUS | Status: AC
  Filled 2023-07-20: qty 100

## 2023-07-20 MED ORDER — ONDANSETRON HCL 4 MG/2ML IJ SOLN: 4.0000 mg | Freq: Four times a day (QID) | INTRAMUSCULAR | Status: DC | PRN

## 2023-07-20 MED ORDER — TRANEXAMIC ACID-NACL 1000-0.7 MG/100ML-% IV SOLN
1000.0000 mg | INTRAVENOUS | Status: AC
  Administered 2023-07-20 (×2): 1000 mg via INTRAVENOUS

## 2023-07-20 MED ORDER — AMLODIPINE BESYLATE 10 MG PO TABS
5.0000 mg | ORAL_TABLET | Freq: Every day | ORAL | Status: DC
  Filled 2023-07-20: qty 1

## 2023-07-20 MED ORDER — ALBUTEROL SULFATE (2.5 MG/3ML) 0.083% IN NEBU: 3.0000 mL | INHALATION_SOLUTION | Freq: Four times a day (QID) | RESPIRATORY_TRACT | Status: DC | PRN

## 2023-07-20 MED ORDER — CEFAZOLIN SODIUM-DEXTROSE 2-4 GM/100ML-% IV SOLN
2.0000 g | Freq: Four times a day (QID) | INTRAVENOUS | Status: AC
  Administered 2023-07-20 (×2): 2 g via INTRAVENOUS
  Filled 2023-07-20 (×2): qty 100

## 2023-07-20 MED ORDER — ACETAMINOPHEN 500 MG PO TABS
1000.0000 mg | ORAL_TABLET | Freq: Three times a day (TID) | ORAL | Status: DC
  Administered 2023-07-20 – 2023-07-21 (×3): 1000 mg via ORAL
  Filled 2023-07-20 (×2): qty 2

## 2023-07-20 MED ORDER — DOCUSATE SODIUM 100 MG PO CAPS
100.0000 mg | ORAL_CAPSULE | Freq: Two times a day (BID) | ORAL | Status: DC
  Administered 2023-07-20 – 2023-07-21 (×2): 100 mg via ORAL
  Filled 2023-07-20 (×2): qty 1

## 2023-07-20 MED ORDER — HYDROCODONE-ACETAMINOPHEN 5-325 MG PO TABS: 1.0000 | ORAL_TABLET | ORAL | Status: DC | PRN

## 2023-07-20 MED ORDER — MIDAZOLAM HCL 5 MG/5ML IJ SOLN
INTRAMUSCULAR | Status: DC | PRN
  Administered 2023-07-20 (×2): 2 mg via INTRAVENOUS

## 2023-07-20 MED ORDER — SODIUM CHLORIDE 0.9 % IR SOLN
Status: DC | PRN
  Administered 2023-07-20: 100 mL

## 2023-07-20 MED ORDER — MORPHINE SULFATE (PF) 2 MG/ML IV SOLN: 0.5000 mg | INTRAVENOUS | Status: DC | PRN

## 2023-07-20 MED ORDER — EPHEDRINE SULFATE-NACL 50-0.9 MG/10ML-% IV SOSY
PREFILLED_SYRINGE | INTRAVENOUS | Status: DC | PRN
  Administered 2023-07-20 (×2): 5 mg via INTRAVENOUS

## 2023-07-20 MED ORDER — LACTATED RINGERS IV SOLN: INTRAVENOUS | Status: DC

## 2023-07-20 MED ORDER — FENTANYL CITRATE (PF) 100 MCG/2ML IJ SOLN
INTRAMUSCULAR | Status: AC
  Filled 2023-07-20: qty 2

## 2023-07-20 MED ORDER — DEXAMETHASONE SODIUM PHOSPHATE 10 MG/ML IJ SOLN
INTRAMUSCULAR | Status: AC
  Filled 2023-07-20: qty 1

## 2023-07-20 MED ORDER — DROPERIDOL 2.5 MG/ML IJ SOLN: 0.6250 mg | Freq: Once | INTRAMUSCULAR | Status: DC | PRN

## 2023-07-20 MED ORDER — ACETAMINOPHEN 10 MG/ML IV SOLN: 1000.0000 mg | Freq: Once | INTRAVENOUS | Status: DC | PRN

## 2023-07-20 MED ORDER — ONDANSETRON HCL 4 MG/2ML IJ SOLN
INTRAMUSCULAR | Status: DC | PRN
  Administered 2023-07-20: 4 mg via INTRAVENOUS

## 2023-07-20 MED ORDER — PROPOFOL 500 MG/50ML IV EMUL: INTRAVENOUS | Status: DC | PRN

## 2023-07-20 MED ORDER — IRBESARTAN 150 MG PO TABS
300.0000 mg | ORAL_TABLET | Freq: Every day | ORAL | Status: DC
  Administered 2023-07-21: 300 mg via ORAL
  Filled 2023-07-20: qty 2

## 2023-07-20 MED ORDER — 0.9 % SODIUM CHLORIDE (POUR BTL) OPTIME
TOPICAL | Status: DC | PRN
  Administered 2023-07-20: 500 mL

## 2023-07-20 MED ORDER — OXYCODONE HCL 5 MG PO TABS: 5.0000 mg | ORAL_TABLET | Freq: Once | ORAL | Status: DC | PRN

## 2023-07-20 MED ORDER — PRAVASTATIN SODIUM 20 MG PO TABS
20.0000 mg | ORAL_TABLET | Freq: Every day | ORAL | Status: DC
  Administered 2023-07-20 – 2023-07-21 (×2): 20 mg via ORAL
  Filled 2023-07-20 (×2): qty 1

## 2023-07-20 MED ORDER — KETAMINE HCL 50 MG/5ML IJ SOSY
PREFILLED_SYRINGE | INTRAMUSCULAR | Status: DC | PRN
  Administered 2023-07-20 (×2): 20 mg via INTRAVENOUS

## 2023-07-20 MED ORDER — TIZANIDINE HCL 4 MG PO TABS
4.0000 mg | ORAL_TABLET | Freq: Three times a day (TID) | ORAL | Status: DC | PRN
Start: 2023-07-20 — End: 2023-07-21

## 2023-07-20 MED ORDER — MENTHOL 3 MG MT LOZG: 1.0000 | LOZENGE | OROMUCOSAL | Status: DC | PRN

## 2023-07-20 MED ORDER — QUETIAPINE FUMARATE 25 MG PO TABS
100.0000 mg | ORAL_TABLET | Freq: Every day | ORAL | Status: DC
  Administered 2023-07-20: 100 mg via ORAL
  Filled 2023-07-20: qty 4

## 2023-07-20 MED ORDER — LEVOTHYROXINE SODIUM 25 MCG PO TABS
125.0000 ug | ORAL_TABLET | Freq: Every day | ORAL | Status: DC
Start: 2023-07-21 — End: 2023-07-21
  Administered 2023-07-21: 125 ug via ORAL
  Filled 2023-07-20: qty 5

## 2023-07-20 MED ORDER — BUPIVACAINE HCL (PF) 0.5 % IJ SOLN
INTRAMUSCULAR | Status: DC | PRN
  Administered 2023-07-20: 3 mL

## 2023-07-20 MED ORDER — PHENOL 1.4 % MT LIQD: 1.0000 | OROMUCOSAL | Status: DC | PRN

## 2023-07-20 MED ORDER — ONDANSETRON HCL 4 MG PO TABS: 4.0000 mg | ORAL_TABLET | Freq: Four times a day (QID) | ORAL | Status: DC | PRN

## 2023-07-20 MED ORDER — CEFAZOLIN SODIUM-DEXTROSE 2-4 GM/100ML-% IV SOLN
2.0000 g | INTRAVENOUS | Status: AC
  Administered 2023-07-20: 2 g via INTRAVENOUS

## 2023-07-20 MED ORDER — PANTOPRAZOLE SODIUM 40 MG PO TBEC
40.0000 mg | DELAYED_RELEASE_TABLET | Freq: Every day | ORAL | Status: DC
  Administered 2023-07-20 – 2023-07-21 (×2): 40 mg via ORAL
  Filled 2023-07-20 (×2): qty 1

## 2023-07-20 MED ORDER — PROPOFOL 1000 MG/100ML IV EMUL
INTRAVENOUS | Status: AC
  Filled 2023-07-20: qty 100

## 2023-07-20 MED ORDER — CHLORHEXIDINE GLUCONATE 0.12 % MT SOLN
OROMUCOSAL | Status: AC
  Filled 2023-07-20: qty 15

## 2023-07-20 MED ORDER — SODIUM CHLORIDE 0.9 % IV SOLN: INTRAVENOUS | Status: DC

## 2023-07-20 MED ORDER — PROPOFOL 500 MG/50ML IV EMUL
INTRAVENOUS | Status: DC | PRN
  Administered 2023-07-20: 150 ug/kg/min via INTRAVENOUS

## 2023-07-20 MED ORDER — KETAMINE HCL 50 MG/5ML IJ SOSY
PREFILLED_SYRINGE | INTRAMUSCULAR | Status: AC
  Filled 2023-07-20: qty 5

## 2023-07-20 SURGICAL SUPPLY — 62 items
BIT DRILL TRIDENT 4X40 SU (BIT) IMPLANT
BLADE CLIPPER SURG (BLADE) IMPLANT
BLADE SAGITTAL AGGR TOOTH XLG (BLADE) ×1 IMPLANT
BNDG COHESIVE 6X5 TAN ST LF (GAUZE/BANDAGES/DRESSINGS) ×1 IMPLANT
BRUSH SCRUB EZ PLAIN DRY (MISCELLANEOUS) ×1 IMPLANT
CHLORAPREP W/TINT 26 (MISCELLANEOUS) ×1 IMPLANT
DERMABOND ADVANCED .7 DNX12 (GAUZE/BANDAGES/DRESSINGS) ×1 IMPLANT
DRAPE C-ARM XRAY 36X54 (DRAPES) ×1 IMPLANT
DRAPE SHEET LG 3/4 BI-LAMINATE (DRAPES) ×3 IMPLANT
DRAPE TABLE BACK 80X90 (DRAPES) ×1 IMPLANT
DRSG MEPILEX SACRM 8.7X9.8 (GAUZE/BANDAGES/DRESSINGS) ×1 IMPLANT
DRSG OPSITE POSTOP 4X8 (GAUZE/BANDAGES/DRESSINGS) ×1 IMPLANT
ELECT BLADE 4.0 EZ CLEAN MEGAD (MISCELLANEOUS) ×1
ELECT REM PT RETURN 9FT ADLT (ELECTROSURGICAL) ×1
ELECTRODE BLDE 4.0 EZ CLN MEGD (MISCELLANEOUS) ×1 IMPLANT
ELECTRODE REM PT RTRN 9FT ADLT (ELECTROSURGICAL) ×1 IMPLANT
GLOVE BIO SURGEON STRL SZ8 (GLOVE) ×1 IMPLANT
GLOVE BIOGEL PI IND STRL 8 (GLOVE) ×1 IMPLANT
GLOVE PI ORTHO PRO STRL 7.5 (GLOVE) ×2 IMPLANT
GLOVE PI ORTHO PRO STRL SZ8 (GLOVE) ×2 IMPLANT
GLOVE SURG SYN 7.5 E (GLOVE) ×1
GLOVE SURG SYN 7.5 PF PI (GLOVE) ×1 IMPLANT
GOWN SRG XL LVL 3 NONREINFORCE (GOWNS) ×1 IMPLANT
GOWN STRL REUS W/ TWL LRG LVL3 (GOWN DISPOSABLE) ×1 IMPLANT
GOWN STRL REUS W/ TWL XL LVL3 (GOWN DISPOSABLE) ×1 IMPLANT
HANDLE YANKAUER SUCT OPEN TIP (MISCELLANEOUS) IMPLANT
HEAD CERAMIC FEMORAL 36MM (Head) IMPLANT
HOOD PEEL AWAY T7 (MISCELLANEOUS) ×2 IMPLANT
INSERT TRIDENT POLY 36 0DEG (Insert) IMPLANT
IV NS 100ML SINGLE PACK (IV SOLUTION) ×1 IMPLANT
KIT PATIENT CARE HANA TABLE (KITS) ×1 IMPLANT
LIGHT WAVEGUIDE WIDE FLAT (MISCELLANEOUS) ×1 IMPLANT
MANIFOLD NEPTUNE II (INSTRUMENTS) ×1 IMPLANT
MARKER SKIN DUAL TIP RULER LAB (MISCELLANEOUS) ×1 IMPLANT
MAT ABSORB FLUID 56X50 GRAY (MISCELLANEOUS) ×1 IMPLANT
NDL SPNL 20GX3.5 QUINCKE YW (NEEDLE) ×1 IMPLANT
NEEDLE SPNL 20GX3.5 QUINCKE YW (NEEDLE) ×1
NS IRRIG 500ML POUR BTL (IV SOLUTION) ×1 IMPLANT
PACK HIP COMPR (MISCELLANEOUS) ×1 IMPLANT
PAD ARMBOARD 7.5X6 YLW CONV (MISCELLANEOUS) ×1 IMPLANT
PENCIL SMOKE EVACUATOR (MISCELLANEOUS) ×1 IMPLANT
SCREW HEX LP 6.5X20 (Screw) IMPLANT
SCREW HEX LP 6.5X30 (Screw) IMPLANT
SHELL CLUSTERHOLE ACETABULAR 5 (Shell) IMPLANT
SLEEVE SCD COMPRESS KNEE MED (STOCKING) ×1 IMPLANT
SOLUTION IRRIG SURGIPHOR (IV SOLUTION) ×1 IMPLANT
STEM STD OFFSET SZ4 32.5 (Stem) IMPLANT
SUT BONE WAX W31G (SUTURE) ×1 IMPLANT
SUT ETHIBOND 2 V 37 (SUTURE) ×1 IMPLANT
SUT SILK 0 30XBRD TIE 6 (SUTURE) ×1 IMPLANT
SUT STRATA 1 CT-1 DLB (SUTURE) ×1
SUT STRATAFIX 14 PDO 48 VLT (SUTURE) ×1 IMPLANT
SUT STRATAFIX PDO 1 14 VIOLET (SUTURE) ×1
SUT VIC AB 0 CT1 36 (SUTURE) ×1 IMPLANT
SUT VIC AB 2-0 CT2 27 (SUTURE) ×1 IMPLANT
SUTURE STRATA SPIR 4-0 18 (SUTURE) ×1 IMPLANT
SYR 30ML LL (SYRINGE) ×2 IMPLANT
TAPE MICROFOAM 4IN (TAPE) IMPLANT
TOWEL OR 17X26 4PK STRL BLUE (TOWEL DISPOSABLE) IMPLANT
TRAP FLUID SMOKE EVACUATOR (MISCELLANEOUS) ×1 IMPLANT
WAND WEREWOLF FASTSEAL 6.0 (MISCELLANEOUS) ×1 IMPLANT
WATER STERILE IRR 1000ML POUR (IV SOLUTION) ×1 IMPLANT

## 2023-07-20 NOTE — Op Note (Signed)
 Patient Name: Sharone Bradshaw  FMW:[id number]  Pre-Operative Diagnosis: Right hip Osteoarthritis  Post-Operative Diagnosis: (same)  Procedure: Right Total Hip Arthroplasty  Components/Implants: Cup: Trident Tritanium Clusterhole 52/E w/ x2 screws   Liner: Neutral X3 poly 36/E  Stem: Insignia #4 Std offset  Head:Biolox ceramic 36 +0  Date of Surgery: 07/20/2023  Surgeon: Arthea Sheer MD  Assistant: Debby Amber PA (present and scrubbed throughout the case, critical for assistance with exposure, retraction, instrumentation, and closure)   Anesthesiologist: Adams  Anesthesia: Spinal   EBL: 150cc  IVF:600cc  Complications: None   Brief history: The patient is a 68 year old male with a history of osteoarthritis of the right hip with pain limiting their range of motion and activities of daily living, which has failed multiple attempts at conservative therapy.  The risks and benefits of total hip arthroplasty as definitive surgical treatment were discussed with the patient, who opted to proceed with the operation.  After outpatient medical clearance and optimization was completed the patient was admitted to [facility] for the procedure.  All preoperative films were reviewed and an appropriate surgical plan was made prior to surgery.   Description of procedure: The patient was brought to the operating room where laterality was confirmed by all those present to be the right side.  The patient was administered spinal anesthesia on a stretcher prior to being moved supine on the operating room table. Patient was given an intravenous dose of antibiotics for surgical prophylaxis and TXA.  All bony prominences and extremities were well padded and the patient was securely attached to the table boots, a perineal post was placed and the patient had a safety strap placed.  Surgical site was prepped with alcohol  and chlorhexidine . The surgical site over the hip was and draped in  typical sterile fashion with multiple layers of adhesive and nonadhesive drapes.  The incision site was marked out with a sterile marker and care was taken to assess the position of the ASIS and ensure appropriate position for the incision.    A surgical timeout was then called with participation of all staff in the room the patient was then a confirmed again and laterality confirmed.  Incision was made over the anterior lateral aspect of the proximal thigh in line with the TFL.  Appropriate retractors were placed and all bleeding vessels were coagulated within the subcutaneous and fatty layers.  An incision was made in the TFL fascia in the interval was carefully identified.  The lateral ascending branches of the circumflex vessels were identified, cauterized and carefully dissected. The main vessels were then tied with a 0 silk hand tie.  Retractors were placed around the superior lateral and inferior medial aspects of the femoral neck and a capsulotomy was performed exposing the hip joint.  Retraction stitches were placed and the capsulotomy to assist with visualization.  Femoral neck cut was then made and the femoral head was extracted after placing the leg in traction.  Bone wax was then applied to the proximal cut surface of the femur and aqua mantis was used to address any bleeding around the femoral neck cut.  Retractors were then placed around the acetabulum to fully visualize the joint space, and the remaining labral tissue was removed and pulvinar was removed.   The acetabulum was then sequentially reamed up to the appropriate size in order to get good fit and fill for the acetabular component while under fluoroscopic guidance.  Acetabular component was then placed and malleted into a  secure fit while confirming position and abduction angle and anteversion utilizing fluoroscopy.  2 screws were then placed in the acetabular cup to assist in securing the cup in place. The cup was irrigated,  a real  neutral liner was placed, impacted, and checked for stability. The femur traction was dropped and sequentially externally rotated while performing a release of the posterior and superomedial tissues off of the proximal femur to allow for mobility, care was taken to preserve the external rotators and piriformis attachments.  The remaining interval between the abductors and the capsule was dissected out and a retractor was placed over the superolateral aspect of the femur over the greater trochanter.  The leg was carefully brought down into extension and adducted to provide visualization of the proximal femur for broaching.  The femur was then sequentially broached up to an appropriate size which provided for good fill and stability to the femoral broach.  A trial neck and head were placed on the femoral broach and the leg was brought up for reduction.  The hip was reduced and manual check of stability was performed.  The hip was found to be stable in flexion internal rotation and extension external rotation.  Leg lengths were confirmed on fluoroscopy.   The hip was then dislocated the trial neck and head were removed.  The leg was then brought down into extension and adduction in the proximal femur was reexposed.  The broach trial was removed and the femur was irrigated with normal saline prior to the real femoral stem being implanted.  After the femoral stem was seated and shown to have good fit and fill the appropriate head was impacted the leg was brought up and reduced.  There was good range of motion with stability in flexion internal rotation and extension external rotation on testing.  Leg lengths were found to be appropriate on fluoroscopic evaluation at this time.  The hip was then irrigated with betdine based surgiphor solution and then saline solution.  The capsulotomy was repaired with Ethibond sutures.  A pericapsular and peritrochanteric cocktail with Exparel  and bupivacaine  was then injected as well  as the subcutaneous tissues. The fascia was closed with a #1 barbed running suture.  The deep tissues were closed with Vicryl sutures the subcutaneous tissues were closed with interrupted Vicryl sutures and a running barbed 4-0 suture.  The skin was then reinforced with Dermabond and a sterile dressing was placed.   The patient was awoken from anesthesia transferred off of the operating room table onto a hospital bed where examination of leg lengths found the leg lengths to be equal with a good distal pulse.  The patient was then transferred to the PACU in stable condition.

## 2023-07-20 NOTE — Interval H&P Note (Signed)
 Patient history and physical updated. Consent reviewed including risks, benefits, and alternatives to surgery. Patient agrees with above plan to proceed with right anterior total hip arthroplasty.

## 2023-07-20 NOTE — Anesthesia Procedure Notes (Signed)
 Spinal  Patient location during procedure: OR Reason for block: surgical anesthesia Staffing Performed: anesthesiologist  Anesthesiologist: Myra Lynwood MATSU, MD Performed by: Myra Lynwood MATSU, MD Authorized by: Myra Lynwood MATSU, MD   Preanesthetic Checklist Completed: patient identified, IV checked, site marked, risks and benefits discussed, surgical consent, monitors and equipment checked, pre-op evaluation and timeout performed Spinal Block Patient position: sitting Prep: DuraPrep Patient monitoring: heart rate, cardiac monitor, continuous pulse ox and blood pressure Approach: midline Location: L4-5 Injection technique: single-shot Needle Needle type: Tuohy  Needle gauge: 22 G Needle length: 9 cm Assessment Sensory level: T4 Events: CSF return

## 2023-07-20 NOTE — Progress Notes (Signed)
 PT Cancellation Note  Patient Details Name: Jerrico Covello MRN: [id number] DOB: 1956/03/09   Cancelled Treatment:    Reason Eval/Treat Not Completed: Other (comment). Pt still with limited sensation/motor movement. Not appropriate for PT evaluation quite yet. Will re-attempt tomorrow.   Thijs Brunton 07/20/2023, 3:16 PM Corean Dade, PT, DPT, GCS [phone]

## 2023-07-20 NOTE — Transfer of Care (Signed)
 Immediate Anesthesia Transfer of Care Note  Patient: Frank Golden  Procedure(s) Performed: TOTAL HIP ARTHROPLASTY ANTERIOR APPROACH (Right: Hip)  Patient Location: PACU  Anesthesia Type:General and Spinal  Level of Consciousness: awake, alert , oriented, and patient cooperative  Airway & Oxygen Therapy: Patient Spontanous Breathing  Post-op Assessment: Report given to RN and Post -op Vital signs reviewed and stable  Post vital signs: Reviewed and stable  Last Vitals:  Vitals Value Taken Time  BP 126/72 07/20/23 1232  Temp 97.5   Pulse 92 07/20/23 1236  Resp 13 07/20/23 1236  SpO2 99 % 07/20/23 1236  Vitals shown include unfiled device data.  Last Pain:  Vitals:   07/20/23 0800  TempSrc: Temporal  PainSc: 2          Complications: No notable events documented.

## 2023-07-20 NOTE — Plan of Care (Signed)
  Problem: Pain Managment: Goal: General experience of comfort will improve and/or be controlled Outcome: Progressing   Problem: Safety: Goal: Ability to remain free from injury will improve Outcome: Progressing

## 2023-07-20 NOTE — H&P (Signed)
 History of Present Illness: Frank Golden is an 68 y.o. male presents for evaluation of his right leg and hip. Patient reports ongoing sharp and achy pain which has stabbing sensations in his right groin with walking. He reports it feels very similar to his left hip prior to his left hip replacement. Reports the pain at rest is a 4 out of 10 but can get up to a 7 or 8 out of 10 with walking and activity and the pain is beginning to limit his function and activities despite conservative treatments with meloxicam home exercises and physical therapy. He is here to discuss a more permanent solution for his right hip. The patient denies fevers, chills, numbness, tingling, shortness of breath, chest pain, recent illness, or any trauma.  He has been using a shoe lift in his left shoe with a 1 inch lift and reports a normal gait with the lift and that his left hip pain has improved since his injection and use of the shoe lift.  The patient is a non-smoker nondiabetic with an A1c of 5.9 and a BMI of 37  Past Medical History: Past Medical History:  Diagnosis Date  Allergic state  Depression  DJD (degenerative joint disease)  Erectile dysfunction  with testosterone deficiency  History of chicken pox  History of pancreatitis  Hyperlipidemia  Hypertension  Hypothyroid  OCD (obsessive compulsive disorder)  Psoriasis  Rheumatoid arthritis (CMS/HHS-HCC)  Sleep apnea  Severe. Uses CPAP.   Past Surgical History: Past Surgical History:  Procedure Laterality Date  APPENDECTOMY 1973  CYSTECTOMY 2007  Removed from back  LAMINECTOMY LUMBAR SPINE 08/12/2005  Decompression, L4-5 central and lateral with partial facetectomy, foraminotomy and discectomy.  ENDOSCOPIC CARPAL TUNNEL RELEASE Bilateral 2009  Left 04/18/2008, Right 04/04/2008  MOHS SURGERY 10/2009  Basal cell carcinoma of nose  ARTHROPLASTY HIP TOTAL Left 12/28/2021  By Dr. Kathlynn  Colon @ [facility] 04/07/2022  Tubular adenomas/SSP/PHx CP/Repeat  6yrs/SMR  TONSILLECTOMY   Past Family History: Family History  Problem Relation Age of Onset  High blood pressure (Hypertension) Mother  Osteoporosis (Thinning of bones) Mother  Penile cancer Father  Stroke Father  Coronary Artery Disease (Blocked arteries around heart) Father  High blood pressure (Hypertension) Father  Myocardial Infarction (Heart attack) Father  Heart disease Father  High blood pressure (Hypertension) Sister  Other Sister  hip replacement  Other Brother  hip & knee replacement  No Known Problems Brother  No Known Problems Maternal Grandmother  No Known Problems Maternal Grandfather  No Known Problems Paternal Grandmother  No Known Problems Paternal Grandfather  No Known Problems Daughter   Medications: Current Outpatient Medications  Medication Sig Dispense Refill  *saw palmetto  oral once daily  albuterol  sulfate (VENTOLIN  HFA INHAL) Inhale into the lungs once daily As needed  amLODIPine  (NORVASC ) 5 MG tablet Take 1 tablet (5 mg total) by mouth once daily 90 tablet 3  busPIRone  (BUSPAR ) 30 MG tablet Take 1 tablet (30 mg total) by mouth 2 (two) times daily 180 tablet 3  cetirizine (ZYRTEC) 10 MG tablet TAKE 1 TABLET BY MOUTH DAILY 90 tablet 2  coenzyme Q10-vitamin E 100-5 mg-unit Cap Take 100 mg by mouth once daily  EPINEPHrine  (EPIPEN ) 0.3 mg/0.3 mL auto-injector Inject 0.25 mLs (0.25 mg total) into the muscle once daily as needed 1 pen 2  FORMOTEROL  FUMARATE INHAL Inhale 1 inhalation into the lungs every morning  hydroCHLOROthiazide  (HYDRODIURIL ) 25 MG tablet take 1 tablet by mouth daily 90 tablet 1  HYDROcodone -acetaminophen  (NORCO) 5-325 mg  tablet Take 1 tablet by mouth every 6 (six) hours as needed for Pain 60 tablet 0  ketoconazole (NIZORAL) 2 % cream APPLY TO THE AFFECTED AREAS OF SKIN ONCE DAILY AS NEEDED 30 g 5  levothyroxine  (SYNTHROID ) 125 MCG tablet Take 1 tablet (125 mcg total) by mouth once daily Take on an empty stomach with a glass of water at  least 30-60 minutes before breakfast. 30 tablet 11  meloxicam (MOBIC) 15 MG tablet Take 1 tablet (15 mg total) by mouth once daily 30 tablet 1  multivitamin tablet Take 1 tablet by mouth once daily.  nortriptyline  (PAMELOR ) 75 MG capsule take one capsule by mouth every evening 90 capsule 1  peg 400-propylene glycol (SYSTANE ULTRA) 0.4-0.3 % drops Place 1 drop into both eyes 2 (two) times daily as needed  pravastatin  (PRAVACHOL ) 20 MG tablet take 1 tablet by mouth at bedtime 90 tablet 1  QUEtiapine  (SEROQUEL ) 100 MG tablet Take 100mg  nightly. 90 tablet 1  tamsulosin  (FLOMAX ) 0.4 mg capsule Take 0.4 mg by mouth once daily  tiZANidine  (ZANAFLEX ) 4 MG tablet TAKE ONE TABLET BY MOUTH THREE TIMES A DAY AS NEEDED 60 tablet 11  triamcinolone  0.1 % cream Apply topically 2 (two) times daily 30 g 2  TURMERIC ROOT EXTRACT ORAL Take 1 tablet by mouth once daily  valsartan (DIOVAN) 320 MG tablet take 1 tablet by mouth daily 90 tablet 1   No current facility-administered medications for this visit.   Allergies: Allergies  Allergen Reactions  Others Anaphylaxis  Uncoded Allergy. Allergen: bee stings  Venom-Honey Bee Anaphylaxis  Gabapentin  Anxiety  Felt like jumping out of skin  Covid-19 (Sars-Cov-2) Vaccine, Ad26 Vomiting  Covid-19 Vaccine, Mrna, [id number], Lnp-S (Pfizer) Nausea And Vomiting  Vomiting x 2  Accupril [Quinapril] Cough  Mirtazapine  Other (See Comments)  NUMBNESS OF FACE NUMBNESS OF FACE  Quinapril Hcl Cough    Visit Vitals: Vitals:  06/23/23 0916  BP: 118/78    Review of Systems:  A comprehensive 14 point ROS was performed, reviewed, and the pertinent orthopaedic findings are documented in the HPI.  Physical Exam: Body mass index is 37.5 kg/m. General/Constitutional: No apparent distress: well-nourished and well developed. Lymphatic: No palpable adenopathy. Pulmonary exam: Lungs clear to auscultation bilaterally no wheezing rales or rhonchi Cardiac exam: Regular rate and  rhythm no obvious murmurs rubs or gallops. Vascular: No edema, swelling or tenderness, except as noted in detailed exam. Integumentary: No impressive skin lesions present, except as noted in detailed exam. Neuro/Psych: Normal mood and affect, oriented to person, place and time. Musculoskeletal: Normal, except as noted in detailed exam and in HPI.  Right hip exam  SKIN: intact SWELLING: none WARMTH: no warmth TENDERNESS: none, Stinchfield Positive ROM: 0 degrees internal rotation and 30 degrees external rotation and pain with internal rotation,; Hip Flexion 90 STRENGTH: limited by pain GAIT: stiff-legged STABILITY: stable to testing CREPITUS: no LEG LENGTH DISCREPANCY: right longer by 1.5 cm NEUROLOGICAL EXAM: normal VASCULAR EXAM: normal LUMBAR SPINE: tenderness: no straight leg raising sign: no motor exam: normal  The contralateral hip was examined for comparison and it showed: TENDERNESS: none ROM: normal and full STRENGTH: normal STABILITY: stable to testing  Hip Imaging :  I have reviewed AP pelvis and lateral hip X-rays (2 views) taken today in the office of the right hip which reveal moderate to severe degenerative changes of the right hip with joint space narrowing, femoral head deformity with cam lesion, osteophyte formation on the lateral acetabulum and inferior acetabulum with  bone-on-bone articulation superiorly and early cystic changes. The left hip is status post total replacement components unchanged in position from previous films no evidence of periprosthetic fracture or loosening.   Assessment:  Encounter Diagnoses  Name Primary?  Right hip pain Yes  Severe obesity (BMI 35.0-39.9) with comorbidity (CMS/HHS-HCC)   Plan: Pj is a 68 year old male presents with severe right hip arthritis. Based upon the patient's continued symptoms and failure to respond to conservative treatment, I have recommended a right total hip replacement for this patient. A long  discussion took place with the patient describing what a total joint replacement is and what the procedure would entail. A hip model, similar to the implants that will be used during the operation, was utilized to demonstrate the implants. Choices of implant manufactures were discussed and reviewed. The ability to secure the implant utilizing cement or cementless (press fit) fixation was discussed. Anterior and posterior exposures were discussed. For this patient an appropriate approach will be anterior.   The hospitalization and post-operative care and rehabilitation were also discussed. The use of perioperative antibiotics and DVT prophylaxis were discussed. The risk, benefits and alternatives to a surgical intervention were discussed at length with the patient. The patient was also advised of risks related to the medical comorbidities and elevated body mass index (BMI). A lengthy discussion took place to review the most common complications including but not limited to: deep vein thrombosis, pulmonary embolus, heart attack, stroke, infection, wound breakdown, heterotopic ossification, dislocation, numbness, leg length in-equality, intraoperative fracture, damage to nerves, tendon,muscles, arteries or other blood vessels, death and other possible complications from anesthesia. The patient was told that we will take steps to minimize these risks by using sterile technique, antibiotics and DVT prophylaxis when appropriate and follow the patient postoperatively in the office setting to monitor progress. The possibility of recurrent pain, no improvement in pain and actual worsening of pain were also discussed with the patient. The risk of dislocation following total hip replacement was discussed and potential precautions to prevent dislocation were reviewed. We do specific conversation about his leg length inequality given the shortening of his left leg. We will try to remove a small amount of length from his right  hip while doing the replacement but will have to be careful as we do not want to create any instability and we did discuss that there is a good chance he will have a persistent leg length inequality after surgery. The patient is okay with a persistent leg length inequality and has become comfortable using his left shoe lift.  Patient asked about and confirms no history of any reactions to metal or metal allergy in the past.  The discharge plan of care focused on the patient going home following surgery. The patient was encouraged to make the necessary arrangements to have someone stay with them when they are discharged home.   The benefits of surgery were discussed with the patient including the potential for improving the patient's current clinical condition through operative intervention. Alternatives to surgical intervention including continued conservative management were also discussed in detail. All questions were answered to the satisfaction of the patient. The patient participated and agreed to the plan of care as well as the use of the recommended implants for their total hip replacement surgery. An information packet was given to the patient to review prior to surgery.   The patient received medical clearance for surgery. All questions answered patient agrees with the plan to have a right anterior total  hip replacement.  Portions of this record have been created using Scientist, clinical (histocompatibility and immunogenetics). Dictation errors have been sought, but may not have been identified and corrected.  Arthea Sheer MD

## 2023-07-20 NOTE — Progress Notes (Signed)
 Updated patient family that he is doing well, just awaiting the spinal anesthesia to wear off, and to be able to move his legs. It is progressing well now. Will continue to keep informed.

## 2023-07-20 NOTE — Plan of Care (Signed)

## 2023-07-20 NOTE — Anesthesia Preprocedure Evaluation (Signed)
 Anesthesia Evaluation  Patient identified by MRN, date of birth, ID band Patient awake    Reviewed: Allergy & Precautions, H&P , NPO status , Patient's Chart, lab work & pertinent test results, reviewed documented beta blocker date and time   Airway Mallampati: II   Neck ROM: full    Dental  (+) Poor Dentition   Pulmonary asthma , sleep apnea , COPD, former smoker   Pulmonary exam normal        Cardiovascular hypertension, On Medications negative cardio ROS Normal cardiovascular exam Rhythm:regular Rate:Normal     Neuro/Psych  PSYCHIATRIC DISORDERS Anxiety Depression    negative neurological ROS     GI/Hepatic negative GI ROS, Neg liver ROS,,,  Endo/Other  Hypothyroidism    Renal/GU negative Renal ROS  negative genitourinary   Musculoskeletal   Abdominal   Peds  Hematology negative hematology ROS (+)   Anesthesia Other Findings Past Medical History: No date: Allergic state No date: Asthma No date: Cancer [facility])     Comment:  skin cancer on face No date: Chronic insomnia No date: COPD (chronic obstructive pulmonary disease) (HCC) No date: Depression No date: DJD (degenerative joint disease) No date: ED (erectile dysfunction) No date: History of chicken pox No date: History of pancreatitis No date: Hyperlipidemia No date: Hypertension No date: Hypertension No date: Hypothyroid No date: MCI (mild cognitive impairment) No date: OCD (obsessive compulsive disorder) No date: Pituitary lesion (HCC) No date: Polyarthralgia No date: Psoriasis No date: Rheumatoid arthritis (HCC) No date: Sleep apnea     Comment:  uses CPAP Past Surgical History: No date: APPENDECTOMY 04/07/2022: COLONOSCOPY; N/A     Comment:  Procedure: COLONOSCOPY;  Surgeon: Onita Elspeth Sharper,              DO;  Location: [facility] ENDOSCOPY;  Service:               Gastroenterology;  Laterality: N/A; No date: CYSTECTOMY No date: endoscopic  carpal tunnell No date: LAMINECTOMY No date: MOHS SURGERY     Comment:  basal cell on nose No date: TONSILLECTOMY 12/28/2021: TOTAL HIP ARTHROPLASTY; Left     Comment:  Procedure: TOTAL HIP ARTHROPLASTY ANTERIOR APPROACH;                Surgeon: Kathlynn Sharper, MD;  Location: [facility] ORS;                Service: Orthopedics;  Laterality: Left; No date: WISDOM TEETH EXTRACTION BMI    Body Mass Index: 34.06 kg/m     Reproductive/Obstetrics negative OB ROS                             Anesthesia Physical Anesthesia Plan  ASA: 3  Anesthesia Plan: Spinal   Post-op Pain Management:    Induction:   PONV Risk Score and Plan: 2  Airway Management Planned:   Additional Equipment:   Intra-op Plan:   Post-operative Plan:   Informed Consent: I have reviewed the patients History and Physical, chart, labs and discussed the procedure including the risks, benefits and alternatives for the proposed anesthesia with the patient or authorized representative who has indicated his/her understanding and acceptance.     Dental Advisory Given  Plan Discussed with: CRNA  Anesthesia Plan Comments:        Anesthesia Quick Evaluation

## 2023-07-21 LAB — CBC
HCT: 37.3 % — ABNORMAL LOW (ref 39.0–52.0)
Hemoglobin: 12.9 g/dL — ABNORMAL LOW (ref 13.0–17.0)
MCH: 29.8 pg (ref 26.0–34.0)
MCHC: 34.6 g/dL (ref 30.0–36.0)
MCV: 86.1 fL (ref 80.0–100.0)
Platelets: 210 10*3/uL (ref 150–400)
RBC: 4.33 MIL/uL (ref 4.22–5.81)
RDW: 13.5 % (ref 11.5–15.5)
WBC: 9.7 10*3/uL (ref 4.0–10.5)
nRBC: 0 % (ref 0.0–0.2)

## 2023-07-21 LAB — BASIC METABOLIC PANEL
Anion gap: 9 (ref 5–15)
BUN: 20 mg/dL (ref 8–23)
CO2: 25 mmol/L (ref 22–32)
Calcium: 8.3 mg/dL — ABNORMAL LOW (ref 8.9–10.3)
Chloride: 105 mmol/L (ref 98–111)
Creatinine, Ser: 1.1 mg/dL (ref 0.61–1.24)
GFR, Estimated: >60 mL/min (ref >60)
Glucose, Bld: 115 mg/dL — ABNORMAL HIGH (ref 70–99)
Potassium: 3.5 mmol/L (ref 3.5–5.1)
Sodium: 139 mmol/L (ref 135–145)

## 2023-07-21 MED ORDER — ENOXAPARIN SODIUM 40 MG/0.4ML IJ SOSY
PREFILLED_SYRINGE | INTRAMUSCULAR | Status: AC
  Filled 2023-07-21: qty 0.4

## 2023-07-21 MED ORDER — ONDANSETRON HCL 4 MG PO TABS: 4.0000 mg | ORAL_TABLET | Freq: Four times a day (QID) | ORAL | 0 refills | Status: AC | PRN

## 2023-07-21 MED ORDER — TRAMADOL HCL 50 MG PO TABS
ORAL_TABLET | ORAL | Status: AC
  Filled 2023-07-21: qty 1

## 2023-07-21 MED ORDER — ACETAMINOPHEN 500 MG PO TABS
ORAL_TABLET | ORAL | Status: AC
  Filled 2023-07-21: qty 2

## 2023-07-21 MED ORDER — OXYCODONE HCL 5 MG PO TABS: 2.5000 mg | ORAL_TABLET | Freq: Four times a day (QID) | ORAL | 0 refills | Status: DC | PRN

## 2023-07-21 MED ORDER — DOCUSATE SODIUM 100 MG PO CAPS: 100.0000 mg | ORAL_CAPSULE | Freq: Two times a day (BID) | ORAL | 0 refills | Status: AC

## 2023-07-21 MED ORDER — CELECOXIB 100 MG PO CAPS: 100.0000 mg | ORAL_CAPSULE | Freq: Two times a day (BID) | ORAL | 0 refills | Status: AC

## 2023-07-21 MED ORDER — ACETAMINOPHEN 500 MG PO TABS: 1000.0000 mg | ORAL_TABLET | Freq: Three times a day (TID) | ORAL | 0 refills | Status: DC

## 2023-07-21 MED ORDER — ENOXAPARIN SODIUM 40 MG/0.4ML IJ SOSY: 40.0000 mg | PREFILLED_SYRINGE | INTRAMUSCULAR | 0 refills | Status: DC

## 2023-07-21 NOTE — Plan of Care (Signed)
 Progressing towards discharge

## 2023-07-21 NOTE — Evaluation (Signed)
 Physical Therapy Evaluation Patient Details Name: Frank Golden MRN: [id number] DOB: 01-11-1956 Today's Date: 07/21/2023  History of Present Illness  Pt admitted for R ant THR. HIstory of L THR 1.5 yrs ago. Pt is POD 1 at time of evaluation.  Clinical Impression  Pt is a pleasant 68 year old male who was admitted for R ant THR. Pt performs bed mobility/transfers with min assist and ambulation with cga and RW. Pt educated on ant hip precautions and reviewed HEP with patient. Pt demonstrates deficits with strength/pain/mobility. Has all DME at home. Pt has met goals for dc from hospital and is safe to begin next therapy at home. Will dc in house at this time.          If plan is discharge home, recommend the following: A little help with walking and/or transfers;A little help with bathing/dressing/bathroom;Assist for transportation;Help with stairs or ramp for entrance   Can travel by private vehicle        Equipment Recommendations None recommended by PT  Recommendations for Other Services       Functional Status Assessment Patient has had a recent decline in their functional status and demonstrates the ability to make significant improvements in function in a reasonable and predictable amount of time.     Precautions / Restrictions Precautions Precautions: Anterior Hip;Fall Precaution Booklet Issued: Yes (comment) Restrictions Weight Bearing Restrictions Per Provider Order: Yes RLE Weight Bearing Per Provider Order: Weight bearing as tolerated      Mobility  Bed Mobility Overal bed mobility: Needs Assistance Bed Mobility: Supine to Sit     Supine to sit: Min assist     General bed mobility comments: cues for sequencing. Once seated at EOB, upright posture noted    Transfers Overall transfer level: Needs assistance Equipment used: Rolling walker (2 wheels) Transfers: Sit to/from Stand Sit to Stand: Min assist           General transfer comment: needed slight  elevated surface for standing. Cues for hand placement    Ambulation/Gait Ambulation/Gait assistance: Contact guard assist Gait Distance (Feet): 300 Feet Assistive device: Rolling walker (2 wheels) Gait Pattern/deviations: Step-through pattern       General Gait Details: initial step to gait pattern with progressing to reciprocal gait pattern. Heavy B UE WBing through [employer]  Stairs Stairs: Yes Stairs assistance: Contact guard assist Stair Management: Two rails, Step to pattern Number of Stairs: 8 General stair comments: up/down with step to gait pattern with safe technique  Wheelchair Mobility     Tilt Bed    Modified Rankin (Stroke Patients Only)       Balance Overall balance assessment: Needs assistance Sitting-balance support: Feet supported Sitting balance-Leahy Scale: Good     Standing balance support: Bilateral upper extremity supported Standing balance-Leahy Scale: Good                               Pertinent Vitals/Pain Pain Assessment Pain Assessment: 0-10 Pain Score: 5  Pain Location: R hip Pain Descriptors / Indicators: Operative site guarding Pain Intervention(s): Limited activity within patient's tolerance, Ice applied, Repositioned, Patient requesting pain meds-RN notified    Home Living Family/patient expects to be discharged to:: Private residence Living Arrangements: Spouse/significant other Available Help at Discharge: Family Type of Home: House Home Access: Stairs to enter Entrance Stairs-Rails: Can reach both Entrance Stairs-Number of Steps: 6   Home Layout: One level Home Equipment: Agricultural Consultant (2 wheels);Cane -  single point;BSC/3in1      Prior Function Prior Level of Function : Independent/Modified Independent             Mobility Comments: previously ambulating with SPC ADLs Comments: indep     Extremity/Trunk Assessment   Upper Extremity Assessment Upper Extremity Assessment: Overall WFL for tasks  assessed    Lower Extremity Assessment Lower Extremity Assessment: Generalized weakness (R LE grossly 3/5; L LE grossly 5/5)       Communication   Communication Communication: No apparent difficulties  Cognition Arousal: Alert Behavior During Therapy: WFL for tasks assessed/performed Overall Cognitive Status: Within Functional Limits for tasks assessed                                          General Comments      Exercises     Assessment/Plan    PT Assessment All further PT needs can be met in the next venue of care  PT Problem List Decreased strength;Decreased mobility;Pain       PT Treatment Interventions      PT Goals (Current goals can be found in the Care Plan section)  Acute Rehab PT Goals Patient Stated Goal: to go home PT Goal Formulation: With patient Time For Goal Achievement: 07/21/23 Potential to Achieve Goals: Good    Frequency       Co-evaluation               AM-PAC PT 6 Clicks Mobility  Outcome Measure Help needed turning from your back to your side while in a flat bed without using bedrails?: None Help needed moving from lying on your back to sitting on the side of a flat bed without using bedrails?: A Little Help needed moving to and from a bed to a chair (including a wheelchair)?: A Little Help needed standing up from a chair using your arms (e.g., wheelchair or bedside chair)?: A Little Help needed to walk in hospital room?: A Little Help needed climbing 3-5 steps with a railing? : A Little 6 Click Score: 19    End of Session Equipment Utilized During Treatment: Gait belt Activity Tolerance: Patient tolerated treatment well Patient left: in bed (seated at EOB) Nurse Communication: Mobility status PT Visit Diagnosis: Muscle weakness (generalized) (M62.81);Difficulty in walking, not elsewhere classified (R26.2);Pain Pain - Right/Left: Right Pain - part of body: Hip    Time: 9075-9041 PT Time Calculation  (min) (ACUTE ONLY): 34 min   Charges:   PT Evaluation $PT Eval Low Complexity: 1 Low PT Treatments $Gait Training: 23-37 mins PT General Charges $$ ACUTE PT VISIT: 1 Visit         Corean Dade, PT, DPT, GCS [phone]   Sadaf Przybysz 07/21/2023, 11:13 AM

## 2023-07-21 NOTE — Progress Notes (Signed)
 Discharge instructions given to patient.  Patient acknowledges and verbilizes understanding of discharge instructions.  Patient awaiting arrival of family member to transport home.

## 2023-07-21 NOTE — Discharge Instructions (Signed)

## 2023-07-21 NOTE — Progress Notes (Signed)
   Subjective: 1 Day Post-Op Procedure(s) (LRB): TOTAL HIP ARTHROPLASTY ANTERIOR APPROACH (Right) Patient reports pain as mild.   Patient is well, and has had no acute complaints or problems Denies any CP, SOB, ABD pain. We will continue therapy today.  Plan is to go Home after hospital stay.  Objective: Vital signs in last 24 hours: Temp:  [97.2 F (36.2 C)-98.5 F (36.9 C)] 98.1 F (36.7 C) (02/07 0728) Pulse Rate:  [78-104] 81 (02/07 0728) Resp:  [12-21] 12 (02/07 0728) BP: (110-144)/(56-97) 110/81 (02/07 0728) SpO2:  [94 %-98 %] 96 % (02/07 0728)  Intake/Output from previous day: 02/06 0701 - 02/07 0700 In: 2402.9 [P.O.:236; I.V.:1766.7; IV Piggyback:400.2] Out: 2675 [Urine:2525; Blood:150] Intake/Output this shift: No intake/output data recorded.  Recent Labs    07/21/23 0618  HGB 12.9*   Recent Labs    07/21/23 0618  WBC 9.7  RBC 4.33  HCT 37.3*  PLT 210   Recent Labs    07/21/23 0618  NA 139  K 3.5  CL 105  CO2 25  BUN 20  CREATININE 1.10  GLUCOSE 115*  CALCIUM 8.3*   No results for input(s): LABPT, INR in the last 72 hours.  EXAM General - Patient is Alert, Appropriate, and Oriented Extremity - Neurovascular intact Sensation intact distally Intact pulses distally Dorsiflexion/Plantar flexion intact No cellulitis present Compartment soft Dressing - dressing C/D/I and no drainage Motor Function - intact, moving foot and toes well on exam.   Past Medical History:  Diagnosis Date   Allergic state    Asthma    Cancer (HCC)    skin cancer on face   Chronic insomnia    COPD (chronic obstructive pulmonary disease) (HCC)    Depression    DJD (degenerative joint disease)    ED (erectile dysfunction)    History of chicken pox    History of pancreatitis    Hyperlipidemia    Hypertension    Hypertension    Hypothyroid    MCI (mild cognitive impairment)    OCD (obsessive compulsive disorder)    Pituitary lesion (HCC)     Polyarthralgia    Psoriasis    Rheumatoid arthritis (HCC)    Sleep apnea    uses CPAP    Assessment/Plan:   1 Day Post-Op Procedure(s) (LRB): TOTAL HIP ARTHROPLASTY ANTERIOR APPROACH (Right) Principal Problem:   S/P total right hip arthroplasty  Estimated body mass index is 34.06 kg/m as calculated from the following:   Height as of this encounter: 5' 8 (1.727 m).   Weight as of this encounter: 101.6 kg. Advance diet Up with therapy Pain well-controlled Labs and vital signs are stable Care management to assist with discharge to home with home health PT today  DVT Prophylaxis - Lovenox , TED hose, and SCDs Weight-Bearing as tolerated to right leg   T. Medford Amber, PA-C [facility] 07/21/2023, 8:00 AM

## 2023-07-21 NOTE — Discharge Summary (Signed)
 Physician Discharge Summary  Patient ID: Frank Golden MRN: [id number] DOB/AGE: [birth year] 67 y.o.  Admit date: 07/20/2023 Discharge date: 07/21/2023  Admission Diagnoses:  S/P total right hip arthroplasty [S03.358]   Discharge Diagnoses: Patient Active Problem List   Diagnosis Date Noted   S/P total right hip arthroplasty 07/20/2023   Status post total hip replacement, left 12/28/2021   Major depressive disorder, recurrent episode with anxious distress (HCC) 12/17/2018   Insomnia due to mental disorder 12/17/2018   Hyperlipidemia 07/26/2018   Depression 07/26/2018   Allergy 07/26/2018   Psoriasis 03/08/2018   Polyarthralgia 03/08/2018   Chronic insomnia 10/03/2016   GAD (generalized anxiety disorder) 10/03/2016   MCI (mild cognitive impairment) with memory loss 10/03/2016   Pituitary lesion (HCC) 10/03/2016   Hypothyroidism due to acquired atrophy of thyroid 11/23/2015   Essential hypertension 11/23/2015   Primary osteoarthritis involving multiple joints 11/23/2015   COPD (chronic obstructive pulmonary disease) (HCC) 09/25/2014   OSA on CPAP 09/25/2014   Obesity 09/25/2014    Past Medical History:  Diagnosis Date   Allergic state    Asthma    Cancer (HCC)    skin cancer on face   Chronic insomnia    COPD (chronic obstructive pulmonary disease) (HCC)    Depression    DJD (degenerative joint disease)    ED (erectile dysfunction)    History of chicken pox    History of pancreatitis    Hyperlipidemia    Hypertension    Hypertension    Hypothyroid    MCI (mild cognitive impairment)    OCD (obsessive compulsive disorder)    Pituitary lesion (HCC)    Polyarthralgia    Psoriasis    Rheumatoid arthritis (HCC)    Sleep apnea    uses CPAP     Transfusion: None   Consultants (if any):   Discharged Condition: Improved  Hospital Course: Frank Golden is an 68 y.o. male who was admitted 07/20/2023 with a diagnosis of S/P total right hip arthroplasty and went to the  operating room on 07/20/2023 and underwent the above named procedures.    Surgeries: Procedure(s): TOTAL HIP ARTHROPLASTY ANTERIOR APPROACH on 07/20/2023 Patient tolerated the surgery well. Taken to PACU where she was stabilized and then transferred to the orthopedic floor.  Started on Lovenox  40 mg q 24 hrs. TEDs and SCDs applied bilaterally. Heels elevated on bed. No evidence of DVT. Negative Homan. Physical therapy started on day #1 for gait training and transfer. OT started day #1 for ADL and assisted devices.  Patient's IV was d/c on day #1. Patient was able to safely and independently complete all PT goals. PT recommending discharge to home.    On post op day #1 patient was stable and ready for discharge to home with home health PT.  Implants: Cup: Trident Tritanium Clusterhole 52/E w/ x2 screws   Liner: Neutral X3 poly 36/E  Stem: Insignia #4 Std offset  Head:Biolox ceramic 36 +0    He was given perioperative antibiotics:  Anti-infectives (From admission, onward)    Start     Dose/Rate Route Frequency Ordered Stop   07/20/23 1600  ceFAZolin  (ANCEF ) IVPB 2g/100 mL premix        2 g 200 mL/hr over 30 Minutes Intravenous Every 6 hours 07/20/23 1509 07/20/23 2250   07/20/23 0745  ceFAZolin  (ANCEF ) IVPB 2g/100 mL premix        2 g 200 mL/hr over 30 Minutes Intravenous On call to O.R. 07/20/23 0740 07/20/23 1035     .  He was given sequential compression devices, early ambulation, and Lovenox , teds for DVT prophylaxis.  He benefited maximally from the hospital stay and there were no complications.    Recent vital signs:  Vitals:   07/21/23 0558 07/21/23 0728  BP: 112/70 110/81  Pulse: 88 81  Resp: 16 12  Temp: 97.9 F (36.6 C) 98.1 F (36.7 C)  SpO2: 96% 96%    Recent laboratory studies:  Lab Results  Component Value Date   HGB 12.9 (L) 07/21/2023   HGB 12.5 (L) 12/29/2021   HGB 13.1 12/28/2021   Lab Results  Component Value Date   WBC 9.7 07/21/2023   PLT 210  07/21/2023   No results found for: INR Lab Results  Component Value Date   NA 139 07/21/2023   K 3.5 07/21/2023   CL 105 07/21/2023   CO2 25 07/21/2023   BUN 20 07/21/2023   CREATININE 1.10 07/21/2023   GLUCOSE 115 (H) 07/21/2023    Discharge Medications:   Allergies as of 07/21/2023       Reactions   Bee Venom Anaphylaxis   Gabapentin  Other (See Comments)   Felt like jumping out of skin   Covid-19 (mrna) Vaccine Nausea And Vomiting   Vomiting x 2   Quinapril Hcl Cough   Remeron  [mirtazapine ] Other (See Comments)   NUMBNESS OF FACE        Medication List     STOP taking these medications    meloxicam 15 MG tablet Commonly known as: MOBIC       TAKE these medications    acetaminophen  500 MG tablet Commonly known as: TYLENOL  Take 2 tablets (1,000 mg total) by mouth every 8 (eight) hours.   albuterol  108 (90 Base) MCG/ACT inhaler Commonly known as: VENTOLIN  HFA Inhale 2 puffs into the lungs every 6 (six) hours as needed for shortness of breath or wheezing.   amLODipine  5 MG tablet Commonly known as: NORVASC  Take 5 mg by mouth daily.   B-Complex/B-12 Tabs Take 1 tablet by mouth daily after breakfast.   budesonide -formoterol  160-4.5 MCG/ACT inhaler Commonly known as: SYMBICORT  Inhale 2 puffs into the lungs 2 (two) times daily. What changed: how much to take   busPIRone  30 MG tablet Commonly known as: BUSPAR  Take 30 mg by mouth 2 (two) times daily.   celecoxib  100 MG capsule Commonly known as: CeleBREX  Take 1 capsule (100 mg total) by mouth 2 (two) times daily for 7 days.   cetirizine 10 MG tablet Commonly known as: ZYRTEC Take 10 mg by mouth daily as needed for allergies.   Coenzyme Q-10 100 MG capsule Take 200 mg by mouth daily after breakfast.   docusate sodium  100 MG capsule Commonly known as: COLACE Take 1 capsule (100 mg total) by mouth 2 (two) times daily.   enoxaparin  40 MG/0.4ML injection Commonly known as: LOVENOX  Inject 0.4 mLs  (40 mg total) into the skin daily for 14 days. Start taking on: July 22, 2023   EPINEPHrine  0.3 mg/0.3 mL Soaj injection Commonly known as: EPI-PEN Inject 0.3 mg into the muscle as needed for anaphylaxis.   fluticasone  50 MCG/ACT nasal spray Commonly known as: FLONASE  Place 2 sprays into the nose daily.   hydrochlorothiazide  25 MG tablet Commonly known as: HYDRODIURIL  Take 25 mg by mouth daily.   ketoconazole 2 % cream Commonly known as: NIZORAL Apply 1 Application topically daily as needed (psoriasis, rash).   levothyroxine  125 MCG tablet Commonly known as: SYNTHROID  Take 125 mcg by mouth daily  before breakfast.   mirabegron  ER 50 MG Tb24 tablet Commonly known as: Myrbetriq  Take 1 tablet (50 mg total) by mouth daily. What changed: when to take this   MULTIVITAMIN ADULT PO Take 1 tablet by mouth daily after breakfast.   nortriptyline  75 MG capsule Commonly known as: PAMELOR  Take 75 mg by mouth at bedtime.   ondansetron  4 MG tablet Commonly known as: ZOFRAN  Take 1 tablet (4 mg total) by mouth every 6 (six) hours as needed for nausea.   oxyCODONE  5 MG immediate release tablet Commonly known as: Roxicodone  Take 0.5-1 tablets (2.5-5 mg total) by mouth every 6 (six) hours as needed.   Polyethyl Glycol-Propyl Glycol 0.4-0.3 % Soln Place 1 drop into both eyes 2 (two) times daily as needed (dry eyes).   pravastatin  20 MG tablet Commonly known as: PRAVACHOL  Take 20 mg by mouth at bedtime.   QUEtiapine  100 MG tablet Commonly known as: SEROQUEL  Take 100 mg by mouth at bedtime.   tamsulosin  0.4 MG Caps capsule Commonly known as: FLOMAX  Take 1 capsule (0.4 mg total) by mouth daily. What changed: how much to take   tiZANidine  4 MG tablet Commonly known as: ZANAFLEX  Take 4 mg by mouth 3 (three) times daily as needed (headaches).   triamcinolone  cream 0.1 % Commonly known as: KENALOG  Apply 1 Application topically daily as needed (rash).   TURMERIC PO Take 1,000  mg by mouth daily.   valsartan 320 MG tablet Commonly known as: DIOVAN Take 320 mg by mouth daily.   vitamin E 180 MG (400 UNITS) capsule Take 400 Units by mouth daily after breakfast.        Diagnostic Studies: DG HIP UNILAT WITH PELVIS 2-3 VIEWS RIGHT Result Date: 07/20/2023 CLINICAL DATA:  Status post total right hip arthroplasty. Intraoperative fluoroscopy. EXAM: DG HIP (WITH OR WITHOUT PELVIS) 2-3V RIGHT COMPARISON:  None Available. FINDINGS: Images were performed intraoperatively without the presence of a radiologist. New total right hip arthroplasty. Redemonstration of partially visualized total left hip arthroplasty. No hardware complication is seen. Total fluoroscopy images: 3 Total fluoroscopy time: 29 seconds Total dose: Radiation Exposure Index (as provided by the fluoroscopic device): 4.90 mGy air Kerma Please see intraoperative findings for further detail. IMPRESSION: Intraoperative fluoroscopy for total right hip arthroplasty. Electronically Signed   By: Tanda Lyons M.D.   On: 07/20/2023 14:04   DG C-Arm 1-60 Min-No Report Result Date: 07/20/2023 Fluoroscopy was utilized by the requesting physician.  No radiographic interpretation.   DG C-Arm 1-60 Min-No Report Result Date: 07/20/2023 Fluoroscopy was utilized by the requesting physician.  No radiographic interpretation.    Disposition: Discharge disposition: 06-Home-Health Care Svc          Follow-up Information     Charlene Debby BROCKS, PA-C Follow up in 2 week(s).   Specialties: Orthopedic Surgery, Emergency Medicine Contact information: [address] [place] [address] [phone]                  Signed: Debby BROCKS Charlene 07/21/2023, 8:07 AM

## 2023-07-24 ENCOUNTER — Encounter: Payer: Self-pay | Admitting: Orthopedic Surgery

## 2023-07-31 ENCOUNTER — Ambulatory Visit: Payer: Medicare Other | Admitting: Urology

## 2023-07-31 VITALS — BP 107/77 | HR 116

## 2023-07-31 DIAGNOSIS — N3941 Urge incontinence: Secondary | ICD-10-CM

## 2023-07-31 LAB — BLADDER SCAN AMB NON-IMAGING: Scan Result: 64

## 2023-07-31 MED ORDER — OXYBUTYNIN CHLORIDE ER 10 MG PO TB24: 10.0000 mg | ORAL_TABLET | Freq: Every day | ORAL | 11 refills | Status: DC

## 2023-07-31 MED ORDER — TAMSULOSIN HCL 0.4 MG PO CAPS: 0.8000 mg | ORAL_CAPSULE | Freq: Every day | ORAL | 3 refills | Status: DC

## 2023-07-31 NOTE — Patient Instructions (Signed)
Sacral Nerve Stimulation: What to Know Sacral nerve stimulation (SNS) is a treatment for problems with the sacral nerves. These nerves control the muscles of your bladder, bowel, and pelvic floor. Before having a sacral nerve stimulator placed, you may have a trial test known as a nerve test. This test helps find out if SNS will help your problem. If your symptoms improve during the test, you'll have a surgery to place the permanent sacral nerve stimulator in your body. Once placed, the sacral nerve stimulator sends mild electric pulses to the sacral nerves to help you control your bladder or bowel. You may need SNS if: Your sacral nerves aren't responding to your brain. You have urinary retention. This is when pee builds up in your bladder and you have trouble telling when your bladder is full. You have an overactive bladder and you sometimes leak pee. You can't always control when you pee. You can't always control when you poop. What are the stages of getting a sacral nerve stimulator? Stage 1: Nerve test In this stage, a temporary wire, called a lead, will be placed in your body. Electrical pulses will be sent from a temporary nerve stimulator device that's outside your body. The pulses will move through the lead to an area close to a sacral nerve. You'll use a handheld remote to adjust the pulses or to turn the device on and off. You may have a pulsing or tapping feeling in areas near the vagina, scrotum, or rectum. This shouldn't be painful. The test will take 1-2 weeks to see if this treatment works. You'll keep a journal of your bladder and bowel symptoms before and after the test. Stage 2: Surgery  For surgery, cuts are made in your lower back. The device and the leads are placed so that the pulses can be sent to the sacral nerves. The cut sites will be closed and bandaged. There will no longer be a lead coming out through the skin. You'll still use a handheld remote to adjust the pulses or  to turn the device on and off. This surgery is usually done under general anesthesia at a hospital. General instructions Tell your health care provider if you become pregnant or plan to become pregnant. You'll need to tell all your providers and any health care team members that you have this device. Remind them before they do any tests or procedures. This information is not intended to replace advice given to you by your health care provider. Make sure you discuss any questions you have with your health care provider. Document Revised: 02/08/2023 Document Reviewed: 02/08/2023 Elsevier Patient Education  2024 [employer]         Urgent PC office-based treatment for overactive bladder Take back control of your life! Urgent PC is a non-drug, non-surgical option for overactive bladder and associated symptoms of urinary urgency, urinary frequency and urge incontinence  Urgent-PC- Since 2003, healthcare professionals have used the Urgent PC Neuromodulation System as an effective office treatment for men and women suffering from overactive bladder, a condition commonly referred to as OAB. Urgent PC is up to 80% effective, even after conservative measures and OAB drugs have failed. Plus, Urgent PC is very low risk, making it a great choice for people unable or unwilling to have more invasive procedures.  How does Urgent PC work?  The Urgent PC system delivers a specific type of neuromodulation called percutaneous tibial nerve stimulation (PTNS). During treatment, a small, slim needle electrode is inserted near your ankle.  The needle electrode is then connected to the battery-powered stimulator. During your 30-minute treatment, mild impulses from the stimulator travel through the needle electrode, along your leg and to the nerves in your pelvis that control bladder function. This process is also referred to as neuromodulation.  What will I feel with Urgent PC therapy?  Because patients may  experience the sensation of the Urgent PC therapy in different ways, it's difficult to say what the treatment would feel like to you. Patients often describe the sensation as "tingling" or "pulsating." Treatment is typically well-tolerated by patients. Urgent PC offers many different levels of stimulation, so your clinician will be able to adjust treatment to suit you as well as address any discomfort that you might experience during treatment.  How often will I need Urgent PC treatments? You will receive an initial series of 12 treatments scheduled about a week apart. If you respond, you will likely need a treatment about once per month to maintain your improvements.  How soon will I see results with Urgent PC? Because Urgent PC gently modifies the signals to achieve bladder control, it usually takes 5-7 weeks for symptoms to change. However, patients respond at different rates. In a review of about 100 patients who had success with Urgent PC, symptoms improved anywhere between 2-12 weeks. For about 20% of these patients, the symptoms of urgency and/or urge incontinence didn't improve until after 8 weeks.1  There is no way to anticipate who will respond earlier, later or not at all. That's why it is important to receive the 12 recommended treatments before you and your physician evaluate whether this therapy is an appropriate and effective choice for you.  How can I receive treatment with Urgent PC? Urgent PC is an option for patients with OAB. If you think you have OAB, talk to your doctor, a urologist or urogynecologist. If you have OAB, the doctor will work with you to determine your own personal treatment plan which usually starts with behavior and diet modifications plus medications. Urgent PC is an excellent option if these options don't work or provide sufficient improvements. Treatment with Urgent PC is typically performed at the office of a urologist, urogynecologist or gynecologist. So, if you  run out of options with your normal doctor, consider visiting one of these specialists.  Does insurance cover Urgent PC? Urgent PC treatment is reimbursed by Medicare across the [place]. Private insurance coverage varies by state. To see if your insurance company covers Urgent PC, use our Coverage Finder or talk to your Healthcare Provider.  Are there patients who should not be treated with Urgent PC? Yes, these include: patients with pacemakers or implantable defibrillators, patients prone to excessive bleeding, patients with nerve damage that could impact either percutaneous tibial nerve or pelvic floor function and patients who are pregnant or planning to become pregnant during the duration of the treatment.  What are the risks associated with Urgent PC? The risks associated with Urgent PC therapy are low. Most common side-effects are temporary and include mild pain or skin inflammation at or near the stimulation site.

## 2023-07-31 NOTE — Progress Notes (Signed)
07/31/2023 2:01 PM   Frank Golden 27-Jun-1955 [id number]  Referring provider: Lynnea Ferrier, MD [address] [facility] [place],  [place] [address]  Chief Complaint  Patient presents with   Follow-up    HPI: I was consulted to assess the patient's urgency incontinence that has had more than 1 year.  He wears 1 or 2 pads a day but up to 5-6 pads a day that can be quite wet.  He denies stress incontinence and bedwetting when he goes from sitting to standing position he feels the need to go.  He voids every hour and cannot hold it for 2 hours.  He has no nocturia   Flow can vary from poor to good.  30 minutes later he can double void a mild to moderate amount.  He thinks he feels empty most times   He has had low back surgery more than 5 years ago and gets injections.  He quit smoking years ago   I reviewed the medical records and he has some medical comorbidities with mild cognitive impairment and some anxiety.     50 g benign prostate   Patient has urgency incontinence and at times it is quite severe.  This is a presentation of the ordinary.  He has a frequent bladder and his flow varies.  His postvoid residual today was 46 mL .    On urodynamics patient did not void was catheterized for 100 mL.  Maximum bladder capacity was 388 mL.  Patient had high pressure bladder overactivity reaching pressure 107 cm of water.  He leaked a small amount.  No stress incontinence with Valsalva pressure of 128 cmH2O.  He was able to generate a voluntary contraction.  Since at least he started to void sitting down but had to stand or to empty his bladder well.  He voided 109 mL with a maximal flow 6 mL/s.  Interrupted flow pattern.  Max voiding pressure 69 cm water.  Residual was 279 mL.  EMG activity decreased during voiding.  He had a little bit of a elongated bladder with trabeculations.   Cystoscopy: Patient underwent flexible cystoscopy utilizing sterile technique.  Penile bulbar  urethra normal.  He had mild bilobar enlargement the prostate.  No high riding bladder neck.  Bladder mucosa and trigone were normal.  No cystitis.  No carcinoma.     Patient has impressive pressure overactivity.  He has risk factors for neurogenic bladder.  He is urodynamically obstructed but he certainly could have persistent or worsening urge incontinence following a prostate procedure.  I will try to help with medical and behavioral therapy.  If this fails we will talk about refractory OAB therapy versus minimally invasive prostate procedures hoping to secondarily improve overactivity but also to improve flow.  My concern is that a prostate procedure may not reach his treatment goal and that this could be neurogenic in origin.  Like to start off with Flomax and have him come back for residual and proceed accordingly.  Combination treatment may be needed   Understands pathophysiology well.  He has been doing physical therapy and improving his back and he thinks the urgency is a bit better.  This may not be coincidental.    Today Patient is almost completely dry and very happy.  Has uncommon urgency with little to no leak.  Flow modestly better.  He is very happy.  Frequency improved Patient had dramatic response to Flomax.  Check another residual in 6 months  and then I will see him yearly   Today Residual 44 mL today.  He had called in and was increased to 0.8 mg of Flomax.  He started to have urge incontinence again.  Role of adding beta 3 agonist discussed.  No cystitis symptoms.  Clinically not infected    I will see him back on Gemtesa in combination with 1 Flomax a day. We will try various overactive bladder medications and proceed accordingly. He agreed with the plan. We talked about learning how to shoot a gun today etc.     Today Patient started to leak at least 3 pads a day and sometimes 5.  He is on Flomax 0.4 mg.  He is on the [place].  It did not help.  He now sprays a bit when he  urinates and this had improved before.  Clinically not infected but urine sent for culture    Back on Flomax 0.8 mg.  Add Myrbetriq.  Check residual in 6 weeks.  Proceed accordingly.  Also try an antimuscarinic if he has not already been on 1.  I might be a little bit concerned again and doing prostate surgery for her is urge incontinence.  We will proceed accordingly   Today Patient had increased Flomax to 0.8 mg and thinks his flow is better.  He wanted 3 months of this sent.  He still wears 4 pads a day with urge incontinence.  He thinks [place] worked little bit better than Myrbetriq.  Clinically not infected.  Frequency stable he does have his hip done and is using a walker.  I think he is on Lovenox for another week   PMH: Past Medical History:  Diagnosis Date   Allergic state    Asthma    Cancer (HCC)    skin cancer on face   Chronic insomnia    COPD (chronic obstructive pulmonary disease) (HCC)    Depression    DJD (degenerative joint disease)    ED (erectile dysfunction)    History of chicken pox    History of pancreatitis    Hyperlipidemia    Hypertension    Hypertension    Hypothyroid    MCI (mild cognitive impairment)    OCD (obsessive compulsive disorder)    Pituitary lesion (HCC)    Polyarthralgia    Psoriasis    Rheumatoid arthritis (HCC)    Sleep apnea    uses CPAP    Surgical History: Past Surgical History:  Procedure Laterality Date   APPENDECTOMY     COLONOSCOPY N/A 04/07/2022   Procedure: COLONOSCOPY;  Surgeon: Jaynie Collins, DO;  Location: [facility] ENDOSCOPY;  Service: Gastroenterology;  Laterality: N/A;   CYSTECTOMY     endoscopic carpal tunnell     LAMINECTOMY     MOHS SURGERY     basal cell on nose   TONSILLECTOMY     TOTAL HIP ARTHROPLASTY Left 12/28/2021   Procedure: TOTAL HIP ARTHROPLASTY ANTERIOR APPROACH;  Surgeon: Kennedy Bucker, MD;  Location: [facility] ORS;  Service: Orthopedics;  Laterality: Left;   TOTAL HIP ARTHROPLASTY Right 07/20/2023    Procedure: TOTAL HIP ARTHROPLASTY ANTERIOR APPROACH;  Surgeon: Reinaldo Berber, MD;  Location: [facility] ORS;  Service: Orthopedics;  Laterality: Right;   WISDOM TEETH EXTRACTION      Home Medications:  Allergies as of 07/31/2023       Reactions   Bee Venom Anaphylaxis   Gabapentin Other (See Comments)   Felt like jumping out of skin   Covid-19 (mrna) Vaccine  Nausea And Vomiting   Vomiting x 2   Quinapril Hcl Cough   Remeron [mirtazapine] Other (See Comments)   NUMBNESS OF FACE        Medication List        Accurate as of July 31, 2023  2:01 PM. If you have any questions, ask your nurse or doctor.          acetaminophen 500 MG tablet Commonly known as: TYLENOL Take 2 tablets (1,000 mg total) by mouth every 8 (eight) hours.   albuterol 108 (90 Base) MCG/ACT inhaler Commonly known as: VENTOLIN HFA Inhale 2 puffs into the lungs every 6 (six) hours as needed for shortness of breath or wheezing.   amLODipine 5 MG tablet Commonly known as: NORVASC Take 5 mg by mouth daily.   B-Complex/B-12 Tabs Take 1 tablet by mouth daily after breakfast.   budesonide-formoterol 160-4.5 MCG/ACT inhaler Commonly known as: SYMBICORT Inhale 2 puffs into the lungs 2 (two) times daily. What changed: how much to take   busPIRone 30 MG tablet Commonly known as: BUSPAR Take 30 mg by mouth 2 (two) times daily.   cetirizine 10 MG tablet Commonly known as: ZYRTEC Take 10 mg by mouth daily as needed for allergies.   Coenzyme Q-10 100 MG capsule Take 200 mg by mouth daily after breakfast.   docusate sodium 100 MG capsule Commonly known as: COLACE Take 1 capsule (100 mg total) by mouth 2 (two) times daily.   enoxaparin 40 MG/0.4ML injection Commonly known as: LOVENOX Inject 0.4 mLs (40 mg total) into the skin daily for 14 days.   EPINEPHrine 0.3 mg/0.3 mL Soaj injection Commonly known as: EPI-PEN Inject 0.3 mg into the muscle as needed for anaphylaxis.   fluticasone 50 MCG/ACT  nasal spray Commonly known as: FLONASE Place 2 sprays into the nose daily.   hydrochlorothiazide 25 MG tablet Commonly known as: HYDRODIURIL Take 25 mg by mouth daily.   ketoconazole 2 % cream Commonly known as: NIZORAL Apply 1 Application topically daily as needed (psoriasis, rash).   levothyroxine 125 MCG tablet Commonly known as: SYNTHROID Take 125 mcg by mouth daily before breakfast.   mirabegron ER 50 MG Tb24 tablet Commonly known as: Myrbetriq Take 1 tablet (50 mg total) by mouth daily. What changed: when to take this   MULTIVITAMIN ADULT PO Take 1 tablet by mouth daily after breakfast.   nortriptyline 75 MG capsule Commonly known as: PAMELOR Take 75 mg by mouth at bedtime.   ondansetron 4 MG tablet Commonly known as: ZOFRAN Take 1 tablet (4 mg total) by mouth every 6 (six) hours as needed for nausea.   oxyCODONE 5 MG immediate release tablet Commonly known as: Roxicodone Take 0.5-1 tablets (2.5-5 mg total) by mouth every 6 (six) hours as needed.   Polyethyl Glycol-Propyl Glycol 0.4-0.3 % Soln Place 1 drop into both eyes 2 (two) times daily as needed (dry eyes).   pravastatin 20 MG tablet Commonly known as: PRAVACHOL Take 20 mg by mouth at bedtime.   QUEtiapine 100 MG tablet Commonly known as: SEROQUEL Take 100 mg by mouth at bedtime.   tamsulosin 0.4 MG Caps capsule Commonly known as: FLOMAX Take 1 capsule (0.4 mg total) by mouth daily. What changed: how much to take   tiZANidine 4 MG tablet Commonly known as: ZANAFLEX Take 4 mg by mouth 3 (three) times daily as needed (headaches).   triamcinolone cream 0.1 % Commonly known as: KENALOG Apply 1 Application topically daily as needed (rash).  TURMERIC PO Take 1,000 mg by mouth daily.   valsartan 320 MG tablet Commonly known as: DIOVAN Take 320 mg by mouth daily.   vitamin E 180 MG (400 UNITS) capsule Take 400 Units by mouth daily after breakfast.        Allergies:  Allergies  Allergen  Reactions   Bee Venom Anaphylaxis   Gabapentin Other (See Comments)    Felt like jumping out of skin   Covid-19 (Mrna) Vaccine Nausea And Vomiting    Vomiting x 2   Quinapril Hcl Cough   Remeron [Mirtazapine] Other (See Comments)    NUMBNESS OF FACE    Family History: Family History  Problem Relation Age of Onset   COPD Mother    Arthritis Mother    Osteoporosis Mother    Anxiety disorder Mother    Depression Mother    Stroke Father    Cancer Father        penile   Heart failure Father    Aneurysm Father    Hypertension Father    Coronary artery disease Father    Anxiety disorder Maternal Grandmother    Depression Maternal Grandmother     Social History:  reports that he quit smoking about 45 years ago. His smoking use included cigarettes. He started smoking about 60 years ago. He has a 15 pack-year smoking history. He has been exposed to tobacco smoke. He has never used smokeless tobacco. He reports current alcohol use of about 3.0 standard drinks of alcohol per week. He reports that he does not use drugs.  ROS:                                        Physical Exam: There were no vitals taken for this visit.  Constitutional:  Alert and oriented, No acute distress. HEENT: [place] AT, moist mucus membranes.  Trachea midline, no masses.  Laboratory Data: Lab Results  Component Value Date   WBC 9.7 07/21/2023   HGB 12.9 (L) 07/21/2023   HCT 37.3 (L) 07/21/2023   MCV 86.1 07/21/2023   PLT 210 07/21/2023    Lab Results  Component Value Date   CREATININE 1.10 07/21/2023    No results found for: "PSA"  No results found for: "TESTOSTERONE"  Lab Results  Component Value Date   HGBA1C 6.1 12/25/2012    Urinalysis    Component Value Date/Time   COLORURINE YELLOW (A) 12/17/2021 1158   APPEARANCEUR Clear 06/12/2023 1435   LABSPEC 1.027 12/17/2021 1158   LABSPEC 1.010 12/24/2012 2305   PHURINE 5.0 12/17/2021 1158   GLUCOSEU Negative  06/12/2023 1435   GLUCOSEU NEGATIVE 12/24/2012 2305   HGBUR NEGATIVE 12/17/2021 1158   BILIRUBINUR Negative 06/12/2023 1435   BILIRUBINUR NEGATIVE 12/24/2012 2305   KETONESUR 5 (A) 12/17/2021 1158   PROTEINUR Negative 06/12/2023 1435   PROTEINUR NEGATIVE 12/17/2021 1158   NITRITE Negative 06/12/2023 1435   NITRITE NEGATIVE 12/17/2021 1158   LEUKOCYTESUR Negative 06/12/2023 1435   LEUKOCYTESUR NEGATIVE 12/17/2021 1158   LEUKOCYTESUR NEGATIVE 12/24/2012 2305    Pertinent Imaging:   Assessment & Plan: The patient will stay on Flomax 0.8 mg.  He will try oxybutynin ER 10 mg 30 x 11.  Reassess in 6 weeks.  I talked about percutaneous tibial nerve stimulation and InterStim with full templates and handouts given.  I mention Botox but I think is ideal with higher risk of retention.  He wants to think about third line therapies.  Proceed accordingly  1. Urge incontinence of urine (Primary)  - Urinalysis, Complete - BLADDER SCAN AMB NON-IMAGING  2. Urgency incontinence  - Urinalysis, Complete - BLADDER SCAN AMB NON-IMAGING   No follow-ups on file.  Martina Sinner, MD  [facility] [address] [place], [place] [address] [phone]

## 2023-07-31 NOTE — Anesthesia Postprocedure Evaluation (Signed)
Anesthesia Post Note  Patient: Frank Golden  Procedure(s) Performed: TOTAL HIP ARTHROPLASTY ANTERIOR APPROACH (Right: Hip)  Patient location during evaluation: PACU Anesthesia Type: Spinal Level of consciousness: awake and alert Pain management: pain level controlled Vital Signs Assessment: post-procedure vital signs reviewed and stable Respiratory status: spontaneous breathing, nonlabored ventilation, respiratory function stable and patient connected to nasal cannula oxygen Cardiovascular status: blood pressure returned to baseline and stable Postop Assessment: no apparent nausea or vomiting Anesthetic complications: no   No notable events documented.   Last Vitals:  Vitals:   07/21/23 0728 07/21/23 1004  BP: 110/81 119/80  Pulse: 81 (!) 102  Resp: 12   Temp: 36.7 C   SpO2: 96% 94%    Last Pain:  Vitals:   07/21/23 0829  TempSrc:   PainSc: 1                  Yevette Edwards

## 2023-09-25 ENCOUNTER — Encounter: Payer: Self-pay | Admitting: Urology

## 2023-09-25 ENCOUNTER — Ambulatory Visit: Payer: Medicare Other | Admitting: Urology

## 2023-09-25 VITALS — BP 93/59 | HR 109 | Ht 68.0 in | Wt 219.0 lb

## 2023-09-25 DIAGNOSIS — N3941 Urge incontinence: Secondary | ICD-10-CM | POA: Diagnosis not present

## 2023-09-25 LAB — URINALYSIS, COMPLETE
Bilirubin, UA: NEGATIVE
Glucose, UA: NEGATIVE
Ketones, UA: NEGATIVE
Leukocytes,UA: NEGATIVE
Nitrite, UA: NEGATIVE
Protein,UA: NEGATIVE
RBC, UA: NEGATIVE
Specific Gravity, UA: 1.015 (ref 1.005–1.030)
Urobilinogen, Ur: 0.2 mg/dL (ref 0.2–1.0)
pH, UA: 5.5 (ref 5.0–7.5)

## 2023-09-25 LAB — MICROSCOPIC EXAMINATION
Bacteria, UA: NONE SEEN
RBC, Urine: NONE SEEN /HPF (ref 0–2)

## 2023-09-25 MED ORDER — OXYBUTYNIN CHLORIDE ER 10 MG PO TB24
10.0000 mg | ORAL_TABLET | Freq: Every day | ORAL | 11 refills | Status: DC
Start: 2023-09-25 — End: 2024-04-01

## 2023-09-25 MED ORDER — TAMSULOSIN HCL 0.4 MG PO CAPS: 0.8000 mg | ORAL_CAPSULE | Freq: Every day | ORAL | 3 refills | Status: DC

## 2023-09-25 NOTE — Progress Notes (Signed)
 09/25/2023 1:02 PM   Frank Golden Nov 10, 1955 [id number]  Referring provider: Lynnea Ferrier, MD [address] [facility] [place],  [place] [address]  Chief Complaint  Patient presents with   Urinary Incontinence    HPI: I was consulted to assess the patient's urgency incontinence that has had more than 1 year.  He wears 1 or 2 pads a day but up to 5-6 pads a day that can be quite wet.  He denies stress incontinence and bedwetting when he goes from sitting to standing position he feels the need to go.  He voids every hour and cannot hold it for 2 hours.  He has no nocturia   Flow can vary from poor to good.  30 minutes later he can double void a mild to moderate amount.  He thinks he feels empty most times   He has had low back surgery more than 5 years ago and gets injections.  He quit smoking years ago   I reviewed the medical records and he has some medical comorbidities with mild cognitive impairment and some anxiety.     50 g benign prostate   Patient has urgency incontinence and at times it is quite severe.  This is a presentation of the ordinary.  He has a frequent bladder and his flow varies.  His postvoid residual today was 46 mL .    On urodynamics patient did not void was catheterized for 100 mL.  Maximum bladder capacity was 388 mL.  Patient had high pressure bladder overactivity reaching pressure 107 cm of water.  He leaked a small amount.  No stress incontinence with Valsalva pressure of 128 cmH2O.  He was able to generate a voluntary contraction.  Since at least he started to void sitting down but had to stand or to empty his bladder well.  He voided 109 mL with a maximal flow 6 mL/s.  Interrupted flow pattern.  Max voiding pressure 69 cm water.  Residual was 279 mL.  EMG activity decreased during voiding.  He had a little bit of a elongated bladder with trabeculations.   Cystoscopy: Patient underwent flexible cystoscopy utilizing sterile technique.   Penile bulbar urethra normal.  He had mild bilobar enlargement the prostate.  No high riding bladder neck.  Bladder mucosa and trigone were normal.  No cystitis.  No carcinoma.     Patient has impressive pressure overactivity.  He has risk factors for neurogenic bladder.  He is urodynamically obstructed but he certainly could have persistent or worsening urge incontinence following a prostate procedure.  I will try to help with medical and behavioral therapy.  If this fails we will talk about refractory OAB therapy versus minimally invasive prostate procedures hoping to secondarily improve overactivity but also to improve flow.  My concern is that a prostate procedure may not reach his treatment goal and that this could be neurogenic in origin.  Like to start off with Flomax and have him come back for residual and proceed accordingly.  Combination treatment may be needed   Understands pathophysiology well.  He has been doing physical therapy and improving his back and he thinks the urgency is a bit better.  This may not be coincidental.    Today Patient is almost completely dry and very happy.  Has uncommon urgency with little to no leak.  Flow modestly better.  He is very happy.  Frequency improved Patient had dramatic response to Flomax.  Check another residual in 6  months and then I will see him yearly   Today Residual 44 mL today.  He had called in and was increased to 0.8 mg of Flomax.  He started to have urge incontinence again.  Role of adding beta 3 agonist discussed.  No cystitis symptoms.  Clinically not infected    I will see him back on Gemtesa in combination with 1 Flomax a day. We will try various overactive bladder medications and proceed accordingly. He agreed with the plan. We talked about learning how to shoot a gun today etc.     Today Patient started to leak at least 3 pads a day and sometimes 5.  He is on Flomax 0.4 mg.  He is on the Ramos.  It did not help.  He now sprays a  bit when he urinates and this had improved before.  Clinically not infected but urine sent for culture     Back on Flomax 0.8 mg.  Add Myrbetriq.  Check residual in 6 weeks.  Proceed accordingly.  Also try an antimuscarinic if he has not already been on 1.  I might be a little bit concerned again and doing prostate surgery for her is urge incontinence.  We will proceed accordingly    Today Patient had increased Flomax to 0.8 mg and thinks his flow is better.  He wanted 3 months of this sent.  He still wears 4 pads a day with urge incontinence.  He thinks [place] worked little bit better than Myrbetriq.  Clinically not infected.  Frequency stable he does have his hip done and is using a walker.  I think he is on Lovenox for another week    The patient will stay on Flomax 0.8 mg. He will try oxybutynin ER 10 mg 30 x 11. Reassess in 6 weeks. I talked about percutaneous tibial nerve stimulation and InterStim with full templates and handouts given. I mention Botox but I think is ideal with higher risk of retention. He wants to think about third line therapies.   Today Frequency stable.  On oxybutynin does not get up at night.  Still wears 3 pads a day for urge incontinence.  Does not want PTNS or InterStim currently.  Clinically not infected and overall doing reasonably well   PMH: Past Medical History:  Diagnosis Date   Allergic state    Asthma    Cancer (HCC)    skin cancer on face   Chronic insomnia    COPD (chronic obstructive pulmonary disease) (HCC)    Depression    DJD (degenerative joint disease)    ED (erectile dysfunction)    History of chicken pox    History of pancreatitis    Hyperlipidemia    Hypertension    Hypertension    Hypothyroid    MCI (mild cognitive impairment)    OCD (obsessive compulsive disorder)    Pituitary lesion (HCC)    Polyarthralgia    Psoriasis    Rheumatoid arthritis (HCC)    Sleep apnea    uses CPAP    Surgical History: Past Surgical History:   Procedure Laterality Date   APPENDECTOMY     COLONOSCOPY N/A 04/07/2022   Procedure: COLONOSCOPY;  Surgeon: Quintin Buckle, DO;  Location: [facility] ENDOSCOPY;  Service: Gastroenterology;  Laterality: N/A;   CYSTECTOMY     endoscopic carpal tunnell     LAMINECTOMY     MOHS SURGERY     basal cell on nose   TONSILLECTOMY     TOTAL  HIP ARTHROPLASTY Left 12/28/2021   Procedure: TOTAL HIP ARTHROPLASTY ANTERIOR APPROACH;  Surgeon: Molli Angelucci, MD;  Location: [facility] ORS;  Service: Orthopedics;  Laterality: Left;   TOTAL HIP ARTHROPLASTY Right 07/20/2023   Procedure: TOTAL HIP ARTHROPLASTY ANTERIOR APPROACH;  Surgeon: Venus Ginsberg, MD;  Location: [facility] ORS;  Service: Orthopedics;  Laterality: Right;   WISDOM TEETH EXTRACTION      Home Medications:  Allergies as of 09/25/2023       Reactions   Bee Venom Anaphylaxis   Gabapentin Other (See Comments)   Felt like jumping out of skin   Covid-19 (mrna) Vaccine Nausea And Vomiting   Vomiting x 2   Quinapril Hcl Cough   Remeron [mirtazapine] Other (See Comments)   NUMBNESS OF FACE        Medication List        Accurate as of September 25, 2023  1:02 PM. If you have any questions, ask your nurse or doctor.          STOP taking these medications    acetaminophen 500 MG tablet Commonly known as: TYLENOL   enoxaparin 40 MG/0.4ML injection Commonly known as: LOVENOX   oxyCODONE 5 MG immediate release tablet Commonly known as: Roxicodone       TAKE these medications    albuterol 108 (90 Base) MCG/ACT inhaler Commonly known as: VENTOLIN HFA Inhale 2 puffs into the lungs every 6 (six) hours as needed for shortness of breath or wheezing.   amLODipine 5 MG tablet Commonly known as: NORVASC Take 5 mg by mouth daily.   B-Complex/B-12 Tabs Take 1 tablet by mouth daily after breakfast.   budesonide-formoterol 160-4.5 MCG/ACT inhaler Commonly known as: SYMBICORT Inhale 2 puffs into the lungs 2 (two) times daily. What  changed: how much to take   busPIRone 30 MG tablet Commonly known as: BUSPAR Take 30 mg by mouth 2 (two) times daily.   cetirizine 10 MG tablet Commonly known as: ZYRTEC Take 10 mg by mouth daily as needed for allergies.   Coenzyme Q-10 100 MG capsule Take 200 mg by mouth daily after breakfast.   docusate sodium 100 MG capsule Commonly known as: COLACE Take 1 capsule (100 mg total) by mouth 2 (two) times daily.   EPINEPHrine 0.3 mg/0.3 mL Soaj injection Commonly known as: EPI-PEN Inject 0.3 mg into the muscle as needed for anaphylaxis.   fluticasone 50 MCG/ACT nasal spray Commonly known as: FLONASE Place 2 sprays into the nose daily.   hydrochlorothiazide 25 MG tablet Commonly known as: HYDRODIURIL Take 25 mg by mouth daily.   ketoconazole 2 % cream Commonly known as: NIZORAL Apply 1 Application topically daily as needed (psoriasis, rash).   levothyroxine 125 MCG tablet Commonly known as: SYNTHROID Take 125 mcg by mouth daily before breakfast.   mirabegron ER 50 MG Tb24 tablet Commonly known as: Myrbetriq Take 1 tablet (50 mg total) by mouth daily. What changed: when to take this   MULTIVITAMIN ADULT PO Take 1 tablet by mouth daily after breakfast.   nortriptyline 75 MG capsule Commonly known as: PAMELOR Take 75 mg by mouth at bedtime.   ondansetron 4 MG tablet Commonly known as: ZOFRAN Take 1 tablet (4 mg total) by mouth every 6 (six) hours as needed for nausea.   oxybutynin 10 MG 24 hr tablet Commonly known as: DITROPAN-XL Take 1 tablet (10 mg total) by mouth daily.   Polyethyl Glycol-Propyl Glycol 0.4-0.3 % Soln Place 1 drop into both eyes 2 (two) times daily as  needed (dry eyes).   pravastatin 20 MG tablet Commonly known as: PRAVACHOL Take 20 mg by mouth at bedtime.   QUEtiapine 100 MG tablet Commonly known as: SEROQUEL Take 100 mg by mouth at bedtime.   tamsulosin 0.4 MG Caps capsule Commonly known as: FLOMAX Take 2 capsules (0.8 mg total) by  mouth daily.   tiZANidine 4 MG tablet Commonly known as: ZANAFLEX Take 4 mg by mouth 3 (three) times daily as needed (headaches).   triamcinolone cream 0.1 % Commonly known as: KENALOG Apply 1 Application topically daily as needed (rash).   TURMERIC PO Take 1,000 mg by mouth daily.   valsartan 320 MG tablet Commonly known as: DIOVAN Take 320 mg by mouth daily.   vitamin E 180 MG (400 UNITS) capsule Take 400 Units by mouth daily after breakfast.        Allergies:  Allergies  Allergen Reactions   Bee Venom Anaphylaxis   Gabapentin Other (See Comments)    Felt like jumping out of skin   Covid-19 (Mrna) Vaccine Nausea And Vomiting    Vomiting x 2   Quinapril Hcl Cough   Remeron [Mirtazapine] Other (See Comments)    NUMBNESS OF FACE    Family History: Family History  Problem Relation Age of Onset   COPD Mother    Arthritis Mother    Osteoporosis Mother    Anxiety disorder Mother    Depression Mother    Stroke Father    Cancer Father        penile   Heart failure Father    Aneurysm Father    Hypertension Father    Coronary artery disease Father    Anxiety disorder Maternal Grandmother    Depression Maternal Grandmother     Social History:  reports that he quit smoking about 45 years ago. His smoking use included cigarettes. He started smoking about 60 years ago. He has a 15 pack-year smoking history. He has been exposed to tobacco smoke. He has never used smokeless tobacco. He reports current alcohol use of about 3.0 standard drinks of alcohol per week. He reports that he does not use drugs.  ROS:                                        Physical Exam: There were no vitals taken for this visit.  Constitutional:  Alert and oriented, No acute distress. HEENT: [place] AT, moist mucus membranes.  Trachea midline, no masses.   Laboratory Data: Lab Results  Component Value Date   WBC 9.7 07/21/2023   HGB 12.9 (L) 07/21/2023   HCT 37.3 (L)  07/21/2023   MCV 86.1 07/21/2023   PLT 210 07/21/2023    Lab Results  Component Value Date   CREATININE 1.10 07/21/2023    No results found for: "PSA"  No results found for: "TESTOSTERONE"  Lab Results  Component Value Date   HGBA1C 6.1 12/25/2012    Urinalysis    Component Value Date/Time   COLORURINE YELLOW (A) 12/17/2021 1158   APPEARANCEUR Clear 06/12/2023 1435   LABSPEC 1.027 12/17/2021 1158   LABSPEC 1.010 12/24/2012 2305   PHURINE 5.0 12/17/2021 1158   GLUCOSEU Negative 06/12/2023 1435   GLUCOSEU NEGATIVE 12/24/2012 2305   HGBUR NEGATIVE 12/17/2021 1158   BILIRUBINUR Negative 06/12/2023 1435   BILIRUBINUR NEGATIVE 12/24/2012 2305   KETONESUR 5 (A) 12/17/2021 1158   PROTEINUR Negative 06/12/2023 1435  PROTEINUR NEGATIVE 12/17/2021 1158   NITRITE Negative 06/12/2023 1435   NITRITE NEGATIVE 12/17/2021 1158   LEUKOCYTESUR Negative 06/12/2023 1435   LEUKOCYTESUR NEGATIVE 12/17/2021 1158   LEUKOCYTESUR NEGATIVE 12/24/2012 2305    Pertinent Imaging:   Assessment & Plan: Renew Flomax and oxybutynin and check residual in 6 months  1. Urge incontinence of urine (Primary)  - Urinalysis, Complete  2. Urgency incontinence  - Urinalysis, Complete   No follow-ups on file.  Devorah Fonder, MD  [facility] [address], [address] [place], [place] [address] [phone]

## 2023-10-17 ENCOUNTER — Other Ambulatory Visit: Payer: Self-pay | Admitting: Physical Medicine & Rehabilitation

## 2023-10-17 DIAGNOSIS — M5416 Radiculopathy, lumbar region: Secondary | ICD-10-CM

## 2023-10-19 ENCOUNTER — Ambulatory Visit
Admission: RE | Admit: 2023-10-19 | Discharge: 2023-10-19 | Disposition: A | Discharge status: Home / Self Care | Source: Ambulatory Visit | Attending: Physical Medicine & Rehabilitation | Admitting: Physical Medicine & Rehabilitation

## 2023-10-19 DIAGNOSIS — M5416 Radiculopathy, lumbar region: Secondary | ICD-10-CM

## 2024-04-01 ENCOUNTER — Ambulatory Visit: Admitting: Urology

## 2024-04-01 VITALS — BP 123/82 | HR 113 | Ht 68.0 in | Wt 241.4 lb

## 2024-04-01 DIAGNOSIS — N3941 Urge incontinence: Secondary | ICD-10-CM | POA: Diagnosis not present

## 2024-04-01 LAB — URINALYSIS, COMPLETE
Bilirubin, UA: NEGATIVE
Glucose, UA: NEGATIVE
Ketones, UA: NEGATIVE
Leukocytes,UA: NEGATIVE
Nitrite, UA: NEGATIVE
Protein,UA: NEGATIVE
RBC, UA: NEGATIVE
Specific Gravity, UA: 1.01 (ref 1.005–1.030)
Urobilinogen, Ur: 0.2 mg/dL (ref 0.2–1.0)
pH, UA: 6 (ref 5.0–7.5)

## 2024-04-01 LAB — MICROSCOPIC EXAMINATION: Bacteria, UA: NONE SEEN

## 2024-04-01 LAB — BLADDER SCAN AMB NON-IMAGING

## 2024-04-01 MED ORDER — TAMSULOSIN HCL 0.4 MG PO CAPS: 0.8000 mg | ORAL_CAPSULE | Freq: Every day | ORAL | 3 refills | Status: AC

## 2024-04-01 MED ORDER — OXYBUTYNIN CHLORIDE ER 10 MG PO TB24
10.0000 mg | ORAL_TABLET | Freq: Every day | ORAL | 11 refills | Status: AC
Start: 2024-04-01 — End: ?

## 2024-04-01 NOTE — Progress Notes (Signed)
 04/01/2024 12:58 PM   Frank Golden 07-Sep-1955 [id number]  Referring provider: Fernande Ophelia JINNY DOUGLAS, MD [address] [facility] [place],  [place] [address]  No chief complaint on file.   HPI: Reviewed lengthy note.  I had offered InterStim and PTNS and not Botox last visit.  Reassess in 6 months on Flomax  and oxybutynin   Patient is at 2 pads a day and very pleased on oxybutynin  and 2 Flomax  a day.  No longer on Myrbetriq .  Clinically not infected.  Frequency improved.  Does not want surgery     PMH: Past Medical History:  Diagnosis Date   Allergic state    Asthma    Cancer (HCC)    skin cancer on face   Chronic insomnia    COPD (chronic obstructive pulmonary disease) (HCC)    Depression    DJD (degenerative joint disease)    ED (erectile dysfunction)    History of chicken pox    History of pancreatitis    Hyperlipidemia    Hypertension    Hypertension    Hypothyroid    MCI (mild cognitive impairment)    OCD (obsessive compulsive disorder)    Pituitary lesion    Polyarthralgia    Psoriasis    Rheumatoid arthritis (HCC)    Sleep apnea    uses CPAP    Surgical History: Past Surgical History:  Procedure Laterality Date   APPENDECTOMY     COLONOSCOPY N/A 04/07/2022   Procedure: COLONOSCOPY;  Surgeon: Onita Elspeth Sharper, DO;  Location: [facility] ENDOSCOPY;  Service: Gastroenterology;  Laterality: N/A;   CYSTECTOMY     endoscopic carpal tunnell     LAMINECTOMY     MOHS SURGERY     basal cell on nose   TONSILLECTOMY     TOTAL HIP ARTHROPLASTY Left 12/28/2021   Procedure: TOTAL HIP ARTHROPLASTY ANTERIOR APPROACH;  Surgeon: Kathlynn Sharper, MD;  Location: [facility] ORS;  Service: Orthopedics;  Laterality: Left;   TOTAL HIP ARTHROPLASTY Right 07/20/2023   Procedure: TOTAL HIP ARTHROPLASTY ANTERIOR APPROACH;  Surgeon: Lorelle Hussar, MD;  Location: [facility] ORS;  Service: Orthopedics;  Laterality: Right;   WISDOM TEETH EXTRACTION      Home Medications:   Allergies as of 04/01/2024       Reactions   Bee Venom Anaphylaxis   Gabapentin  Other (See Comments)   Felt like jumping out of skin   Covid-19 (mrna) Vaccine Nausea And Vomiting   Vomiting x 2   Quinapril Hcl Cough   Remeron  [mirtazapine ] Other (See Comments)   NUMBNESS OF FACE        Medication List        Accurate as of April 01, 2024 12:58 PM. If you have any questions, ask your nurse or doctor.          albuterol  108 (90 Base) MCG/ACT inhaler Commonly known as: VENTOLIN  HFA Inhale 2 puffs into the lungs every 6 (six) hours as needed for shortness of breath or wheezing.   amLODipine  5 MG tablet Commonly known as: NORVASC  Take 5 mg by mouth daily.   B-Complex/B-12 Tabs Take 1 tablet by mouth daily after breakfast.   budesonide -formoterol  160-4.5 MCG/ACT inhaler Commonly known as: SYMBICORT  Inhale 2 puffs into the lungs 2 (two) times daily. What changed: how much to take   busPIRone  30 MG tablet Commonly known as: BUSPAR  Take 30 mg by mouth 2 (two) times daily.   cetirizine 10 MG tablet Commonly known as: ZYRTEC Take 10 mg by mouth  daily as needed for allergies.   Coenzyme Q-10 100 MG capsule Take 200 mg by mouth daily after breakfast.   docusate sodium  100 MG capsule Commonly known as: COLACE Take 1 capsule (100 mg total) by mouth 2 (two) times daily.   EPINEPHrine  0.3 mg/0.3 mL Soaj injection Commonly known as: EPI-PEN Inject 0.3 mg into the muscle as needed for anaphylaxis.   fluticasone  50 MCG/ACT nasal spray Commonly known as: FLONASE  Place 2 sprays into the nose daily.   hydrochlorothiazide  25 MG tablet Commonly known as: HYDRODIURIL  Take 25 mg by mouth daily.   ketoconazole 2 % cream Commonly known as: NIZORAL Apply 1 Application topically daily as needed (psoriasis, rash).   levothyroxine  125 MCG tablet Commonly known as: SYNTHROID  Take 125 mcg by mouth daily before breakfast.   mirabegron  ER 50 MG Tb24 tablet Commonly known  as: Myrbetriq  Take 1 tablet (50 mg total) by mouth daily. What changed: when to take this   MULTIVITAMIN ADULT PO Take 1 tablet by mouth daily after breakfast.   nortriptyline  75 MG capsule Commonly known as: PAMELOR  Take 75 mg by mouth at bedtime.   ondansetron  4 MG tablet Commonly known as: ZOFRAN  Take 1 tablet (4 mg total) by mouth every 6 (six) hours as needed for nausea.   oxybutynin  10 MG 24 hr tablet Commonly known as: DITROPAN -XL Take 1 tablet (10 mg total) by mouth daily.   Polyethyl Glycol-Propyl Glycol 0.4-0.3 % Soln Place 1 drop into both eyes 2 (two) times daily as needed (dry eyes).   pravastatin  20 MG tablet Commonly known as: PRAVACHOL  Take 20 mg by mouth at bedtime.   QUEtiapine  100 MG tablet Commonly known as: SEROQUEL  Take 100 mg by mouth at bedtime.   tamsulosin  0.4 MG Caps capsule Commonly known as: FLOMAX  Take 2 capsules (0.8 mg total) by mouth daily.   tiZANidine  4 MG tablet Commonly known as: ZANAFLEX  Take 4 mg by mouth 3 (three) times daily as needed (headaches).   triamcinolone  cream 0.1 % Commonly known as: KENALOG  Apply 1 Application topically daily as needed (rash).   TURMERIC PO Take 1,000 mg by mouth daily.   valsartan 320 MG tablet Commonly known as: DIOVAN Take 320 mg by mouth daily.   vitamin E 180 MG (400 UNITS) capsule Take 400 Units by mouth daily after breakfast.        Allergies:  Allergies  Allergen Reactions   Bee Venom Anaphylaxis   Gabapentin  Other (See Comments)    Felt like jumping out of skin   Covid-19 (Mrna) Vaccine Nausea And Vomiting    Vomiting x 2   Quinapril Hcl Cough   Remeron  [Mirtazapine ] Other (See Comments)    NUMBNESS OF FACE    Family History: Family History  Problem Relation Age of Onset   COPD Mother    Arthritis Mother    Osteoporosis Mother    Anxiety disorder Mother    Depression Mother    Stroke Father    Cancer Father        penile   Heart failure Father    Aneurysm  Father    Hypertension Father    Coronary artery disease Father    Anxiety disorder Maternal Grandmother    Depression Maternal Grandmother     Social History:  reports that he quit smoking about 45 years ago. His smoking use included cigarettes. He started smoking about 60 years ago. He has a 15 pack-year smoking history. He has been exposed to tobacco smoke. He  has never used smokeless tobacco. He reports current alcohol  use of about 3.0 standard drinks of alcohol  per week. He reports that he does not use drugs.  ROS:                                        Physical Exam: There were no vitals taken for this visit.  Constitutional:  Alert and oriented, No acute distress. HEENT: [place] AT, moist mucus membranes.  Trachea midline, no masses.   Laboratory Data: Lab Results  Component Value Date   WBC 9.7 07/21/2023   HGB 12.9 (L) 07/21/2023   HCT 37.3 (L) 07/21/2023   MCV 86.1 07/21/2023   PLT 210 07/21/2023    Lab Results  Component Value Date   CREATININE 1.10 07/21/2023    No results found for: PSA  No results found for: TESTOSTERONE  Lab Results  Component Value Date   HGBA1C 6.1 12/25/2012    Urinalysis    Component Value Date/Time   COLORURINE YELLOW (A) 12/17/2021 1158   APPEARANCEUR Clear 09/25/2023 1302   LABSPEC 1.027 12/17/2021 1158   LABSPEC 1.010 12/24/2012 2305   PHURINE 5.0 12/17/2021 1158   GLUCOSEU Negative 09/25/2023 1302   GLUCOSEU NEGATIVE 12/24/2012 2305   HGBUR NEGATIVE 12/17/2021 1158   BILIRUBINUR Negative 09/25/2023 1302   BILIRUBINUR NEGATIVE 12/24/2012 2305   KETONESUR 5 (A) 12/17/2021 1158   PROTEINUR Negative 09/25/2023 1302   PROTEINUR NEGATIVE 12/17/2021 1158   NITRITE Negative 09/25/2023 1302   NITRITE NEGATIVE 12/17/2021 1158   LEUKOCYTESUR Negative 09/25/2023 1302   LEUKOCYTESUR NEGATIVE 12/17/2021 1158   LEUKOCYTESUR NEGATIVE 12/24/2012 2305    Pertinent Imaging:   Assessment & Plan: Both  prescriptions renewed 90 days and 3 refills and see in 1 year  1. Urgency incontinence (Primary)  - Bladder Scan (Post Void Residual) in office - Urinalysis, Complete  2. Urge incontinence of urine    No follow-ups on file.  Glendia DELENA Elizabeth, MD  [facility] [address] [place], [place] [address] [phone]

## 2025-03-31 ENCOUNTER — Ambulatory Visit: Admitting: Urology
